# Patient Record
Sex: Female | Born: 1980 | Race: White | Hispanic: No | Marital: Single | Smoking: Current every day smoker
Health system: Southern US, Community
[De-identification: ages and names within clinical notes are randomized; demographics above are authoritative.]

## PROBLEM LIST (undated history)

## (undated) DIAGNOSIS — F419 Anxiety disorder, unspecified: Secondary | ICD-10-CM

## (undated) DIAGNOSIS — I10 Essential (primary) hypertension: Secondary | ICD-10-CM

---

## 2001-09-27 HISTORY — PX: TUBAL LIGATION: SHX77

## 2005-09-10 ENCOUNTER — Emergency Department: Payer: Self-pay | Admitting: General Practice

## 2006-03-27 ENCOUNTER — Emergency Department: Payer: Self-pay | Admitting: Emergency Medicine

## 2006-05-04 ENCOUNTER — Emergency Department: Payer: Self-pay | Admitting: Emergency Medicine

## 2006-12-19 ENCOUNTER — Emergency Department: Payer: Self-pay | Admitting: Emergency Medicine

## 2007-05-25 ENCOUNTER — Emergency Department: Payer: Self-pay | Admitting: Emergency Medicine

## 2008-06-25 ENCOUNTER — Emergency Department: Payer: Self-pay | Admitting: Emergency Medicine

## 2012-02-29 ENCOUNTER — Emergency Department: Payer: Self-pay | Admitting: Emergency Medicine

## 2012-02-29 LAB — CBC
HCT: 41.2 % (ref 35.0–47.0)
HGB: 13.5 g/dL (ref 12.0–16.0)
MCHC: 32.8 g/dL (ref 32.0–36.0)
Platelet: 290 10*3/uL (ref 150–440)
RBC: 4.68 10*6/uL (ref 3.80–5.20)
WBC: 8.3 10*3/uL (ref 3.6–11.0)

## 2012-02-29 LAB — URINALYSIS, COMPLETE
Bilirubin,UR: NEGATIVE
Blood: NEGATIVE
Glucose,UR: NEGATIVE mg/dL (ref 0–75)
Nitrite: NEGATIVE
Ph: 5 (ref 4.5–8.0)
RBC,UR: 2 /HPF (ref 0–5)
WBC UR: 6 /HPF (ref 0–5)

## 2012-02-29 LAB — COMPREHENSIVE METABOLIC PANEL
Albumin: 3.5 g/dL (ref 3.4–5.0)
Anion Gap: 9 (ref 7–16)
Chloride: 108 mmol/L — ABNORMAL HIGH (ref 98–107)
Creatinine: 0.73 mg/dL (ref 0.60–1.30)
EGFR (African American): >60
Glucose: 88 mg/dL (ref 65–99)
Osmolality: 281 (ref 275–301)
Potassium: 4 mmol/L (ref 3.5–5.1)
SGOT(AST): 27 U/L (ref 15–37)
SGPT (ALT): 26 U/L

## 2012-02-29 LAB — LIPASE, BLOOD: Lipase: 154 U/L (ref 73–393)

## 2012-03-01 LAB — URINALYSIS, COMPLETE
Bilirubin,UR: NEGATIVE
Blood: NEGATIVE
Glucose,UR: NEGATIVE mg/dL (ref 0–75)
Leukocyte Esterase: NEGATIVE
Nitrite: NEGATIVE
Ph: 7 (ref 4.5–8.0)
RBC,UR: <1 /HPF (ref 0–5)
Specific Gravity: 1.009 (ref 1.003–1.030)
WBC UR: 1 /HPF (ref 0–5)

## 2012-11-20 ENCOUNTER — Emergency Department: Payer: Self-pay | Admitting: Emergency Medicine

## 2012-11-20 LAB — COMPREHENSIVE METABOLIC PANEL
Albumin: 3.7 g/dL (ref 3.4–5.0)
Anion Gap: 3 — ABNORMAL LOW (ref 7–16)
Bilirubin,Total: 0.3 mg/dL (ref 0.2–1.0)
Co2: 27 mmol/L (ref 21–32)
Creatinine: 0.75 mg/dL (ref 0.60–1.30)
EGFR (Non-African Amer.): >60
Osmolality: 277 (ref 275–301)
Potassium: 4.4 mmol/L (ref 3.5–5.1)
SGOT(AST): 28 U/L (ref 15–37)
SGPT (ALT): 26 U/L (ref 12–78)
Sodium: 139 mmol/L (ref 136–145)
Total Protein: 7.9 g/dL (ref 6.4–8.2)

## 2012-11-20 LAB — CBC
HCT: 44.8 % (ref 35.0–47.0)
MCH: 28.4 pg (ref 26.0–34.0)
MCV: 88 fL (ref 80–100)
Platelet: 333 10*3/uL (ref 150–440)
RDW: 13.8 % (ref 11.5–14.5)
WBC: 13.3 10*3/uL — ABNORMAL HIGH (ref 3.6–11.0)

## 2012-11-20 LAB — URINALYSIS, COMPLETE
Leukocyte Esterase: NEGATIVE
Nitrite: NEGATIVE
Ph: 5 (ref 4.5–8.0)
RBC,UR: 5 /HPF (ref 0–5)
Squamous Epithelial: 27
WBC UR: 1 /HPF (ref 0–5)

## 2012-11-20 LAB — LIPASE, BLOOD: Lipase: 166 U/L (ref 73–393)

## 2013-04-15 IMAGING — US ABDOMEN ULTRASOUND LIMITED
1 series · 14 of 25 positions shown · non-contrast
Comparison: none

REASON FOR EXAM: RUQ pain, nausea
COMMENTS:   Body Site: GB and Fossa, CBD, Head of Pancreas

[Series 1: abdomen ultrasound limited · 0.23mm/px · 14 of 40 slices shown]
[im 1/40]
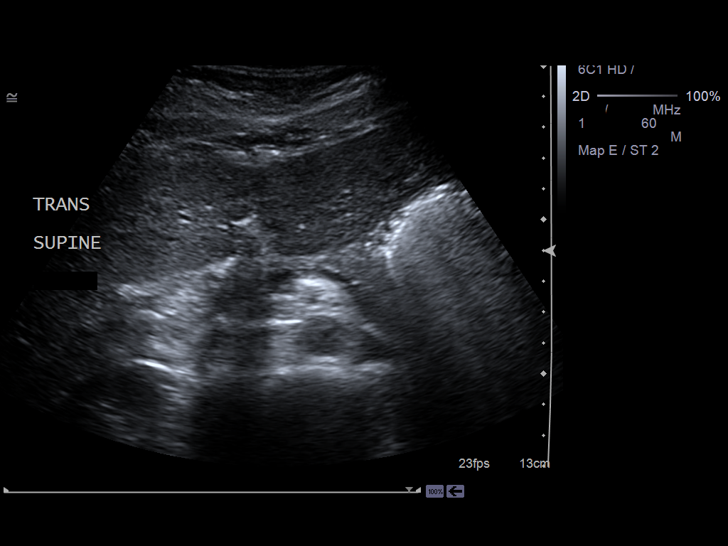
[im 4/40]
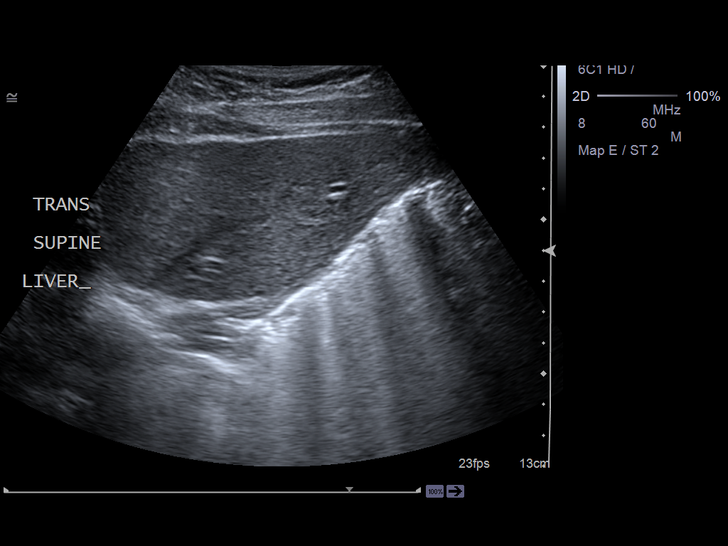
[im 7/40]
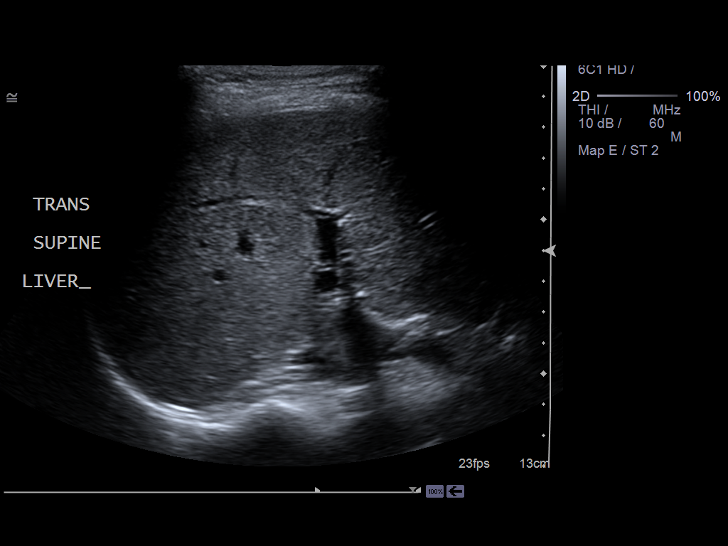
[im 10/40]
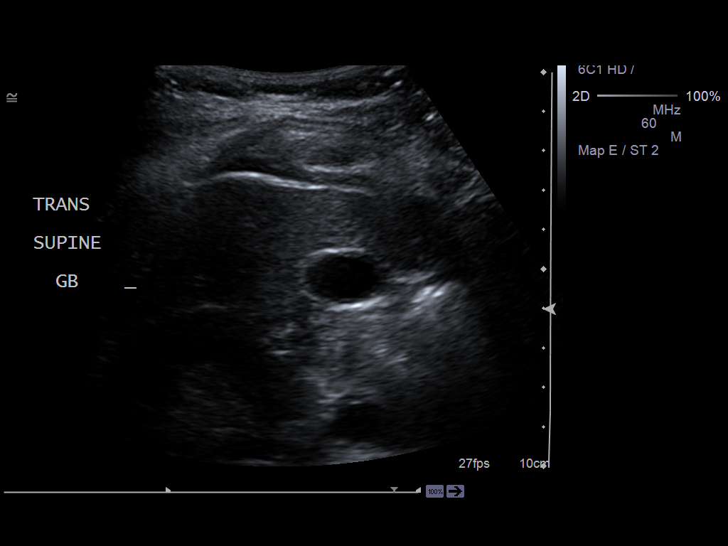
[im 14/40]
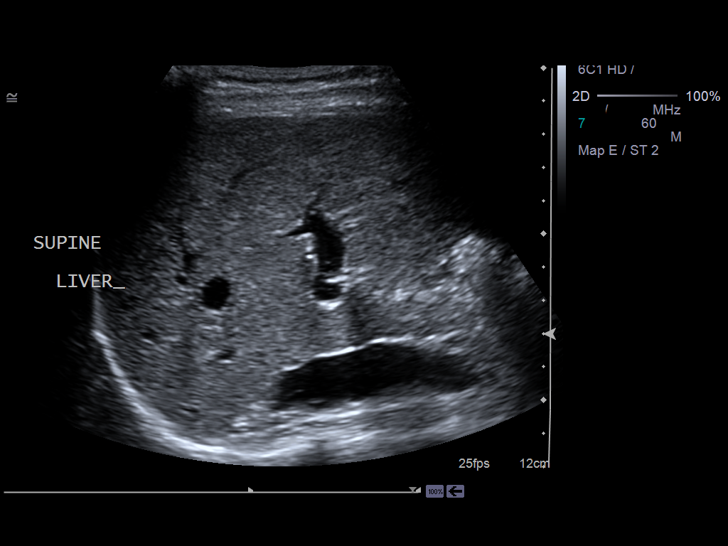
[im 15/40]
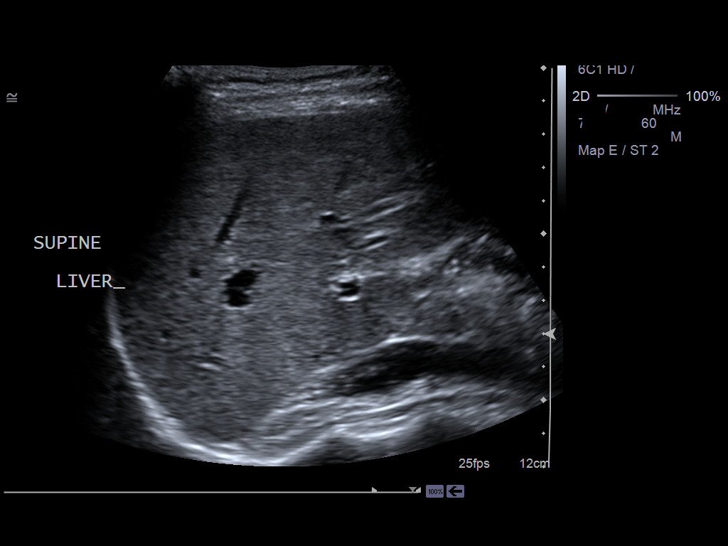
[im 18/40]
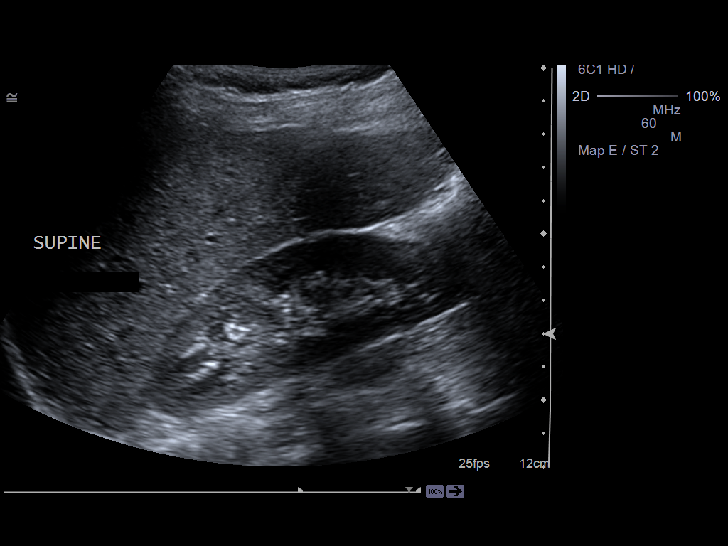
[im 22/40]
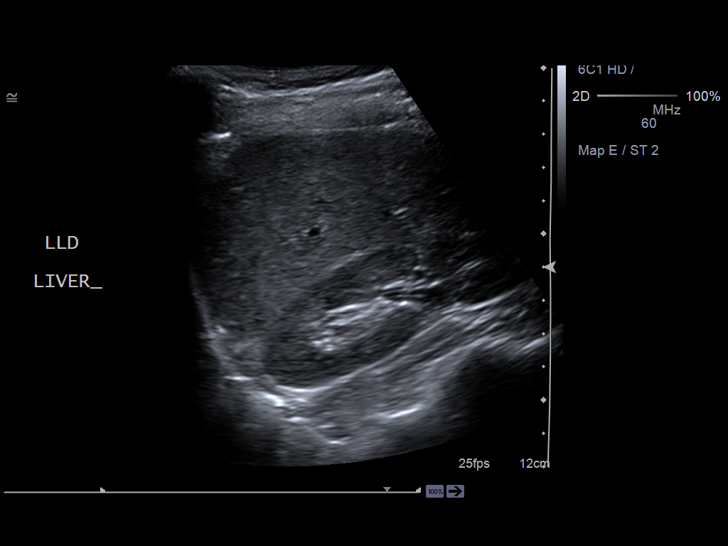
[im 25/40]
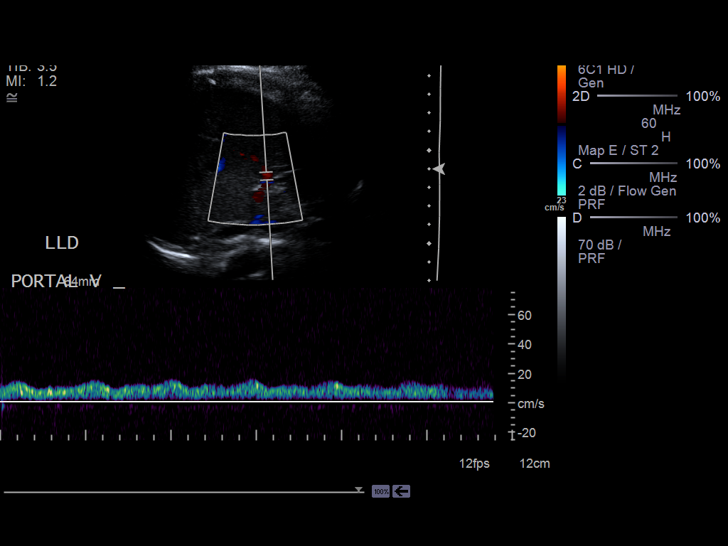
[im 27/40]
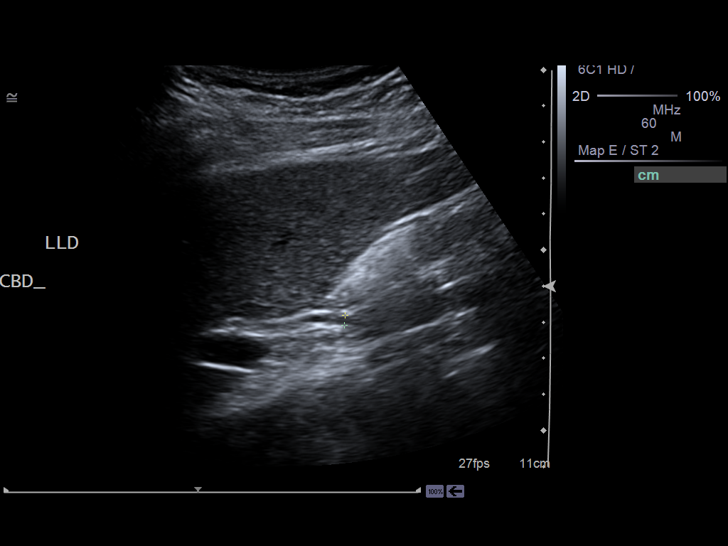
[im 30/40]
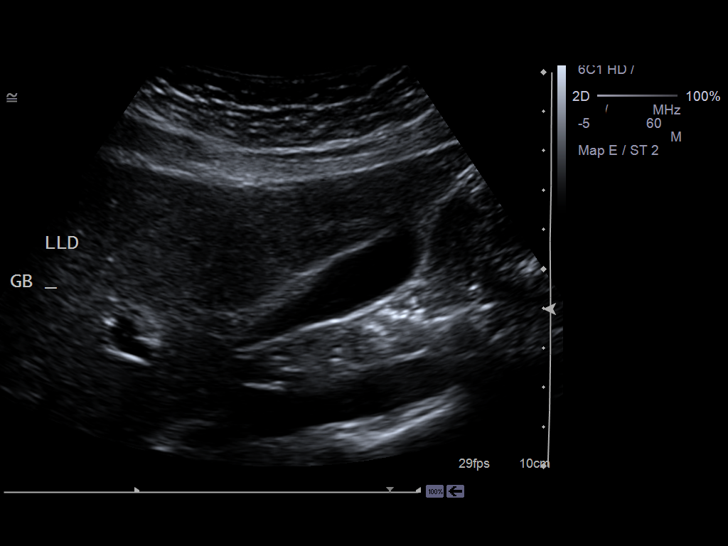
[im 33/40]
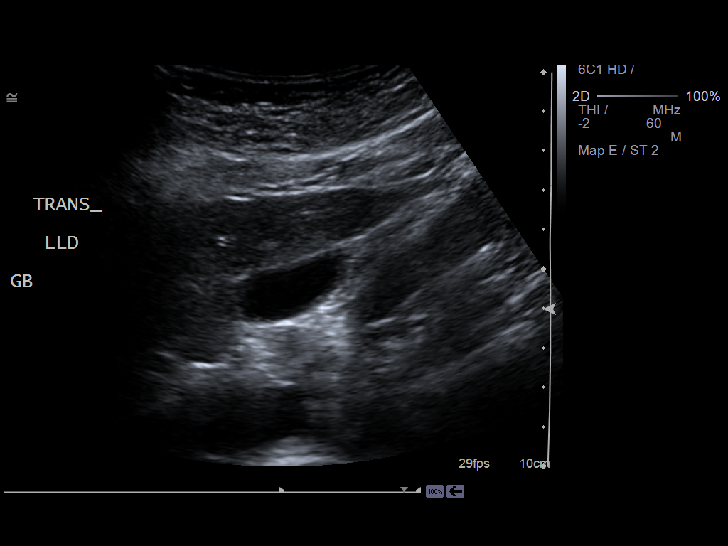
[im 36/40]
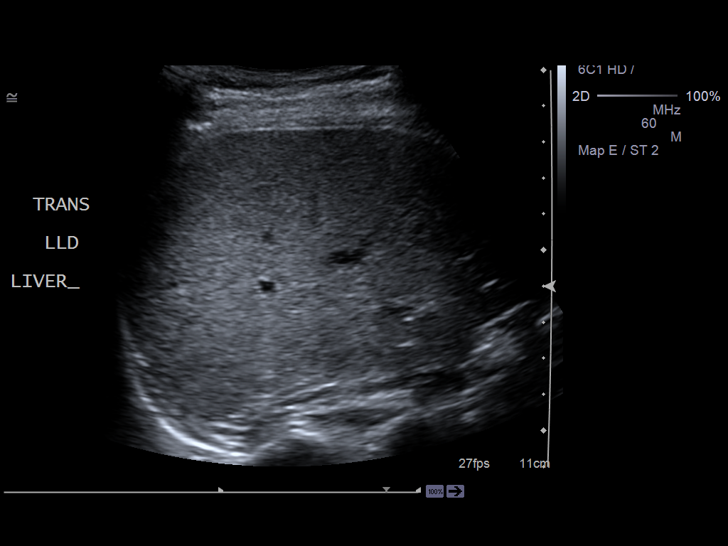
[im 40/40]
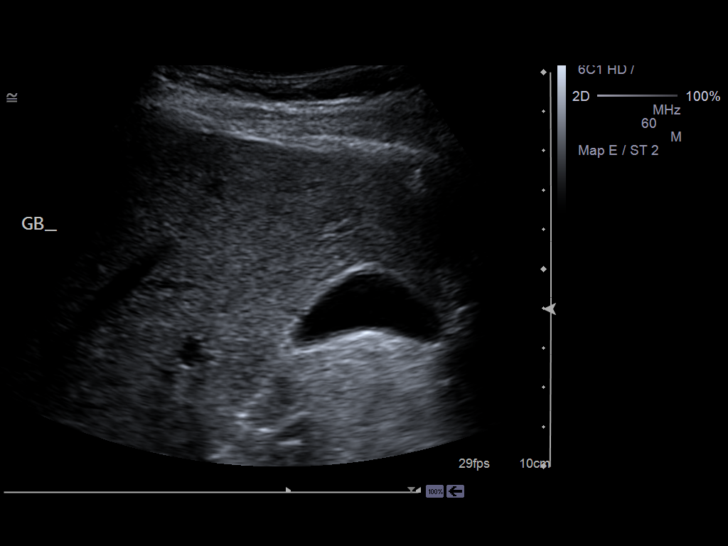

[14 of 25 positions shown; findings below may reference images not displayed]

PROCEDURE:     US  - US ABDOMEN LIMITED SURVEY  - March 01, 2012  [DATE]

RESULT:     A limited right upper quadrant ultrasound was performed. The
gallbladder is normal in appearance with no evidence of stones or positive
sonographic Murphy's sign. There is no gallbladder wall thickening. The
common bile duct measured 3.2 mm in diameter. The pancreatic head is normal
in echotexture and contour. The observed portions of the liver exhibit no
acute abnormality. Portal venous flow is normal in direction toward the
liver. The right kidney is normal in appearance where visualized.
IMPRESSION: Normal right upper quadrant ultrasound examination.

## 2013-04-15 IMAGING — CT CT HEAD WITHOUT CONTRAST
2 series · 16 of 30 positions shown, 20 images · non-contrast
Comparison: none

REASON FOR EXAM: right arm and leg weakness
COMMENTS:

[Series 2: without · axial · non-contrast · 0.39mm/px · z∈[+1098,+1218]mm · 13 of 30 slices shown, 17 images]
[im 3/30  brain]
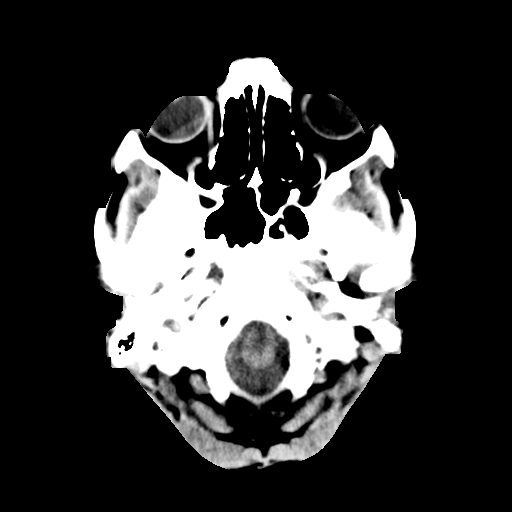
[im 3/30  bone]
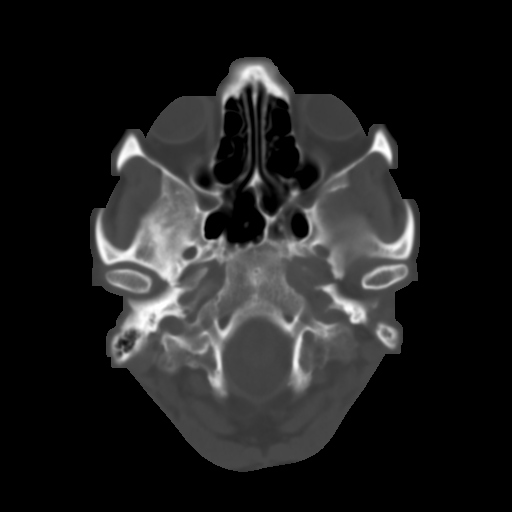
[im 5/30  brain]
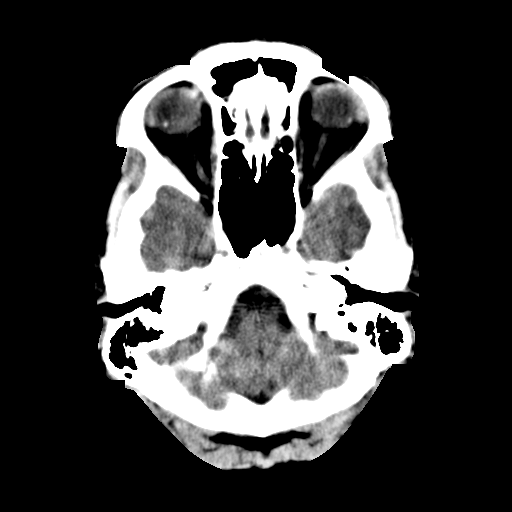
[im 7/30  brain]
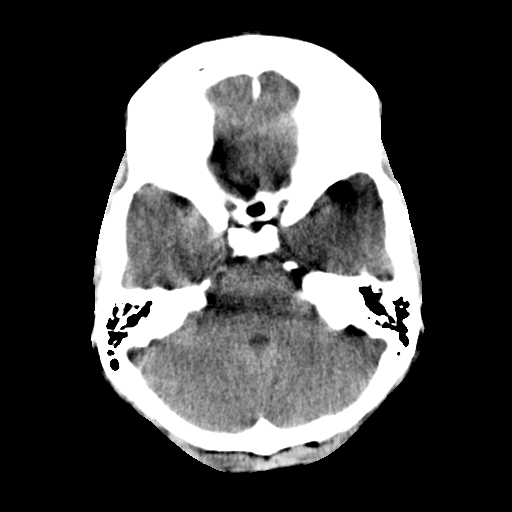
[im 9/30  brain]
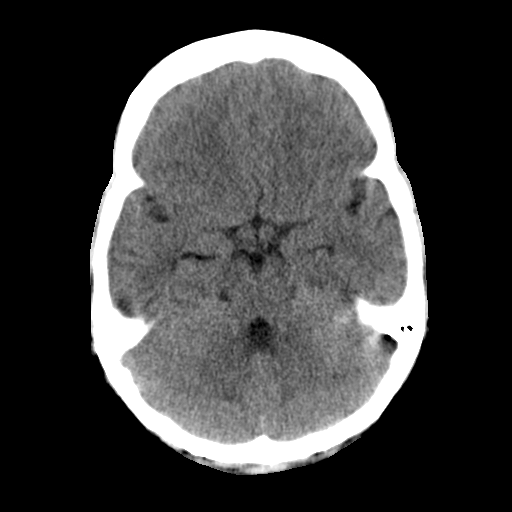
[im 11/30  brain]
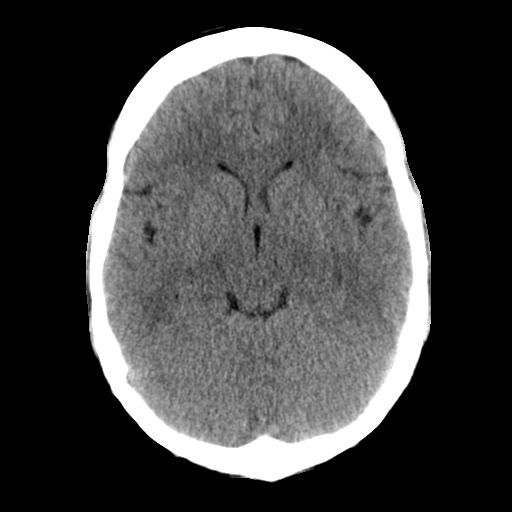
[im 11/30  bone]
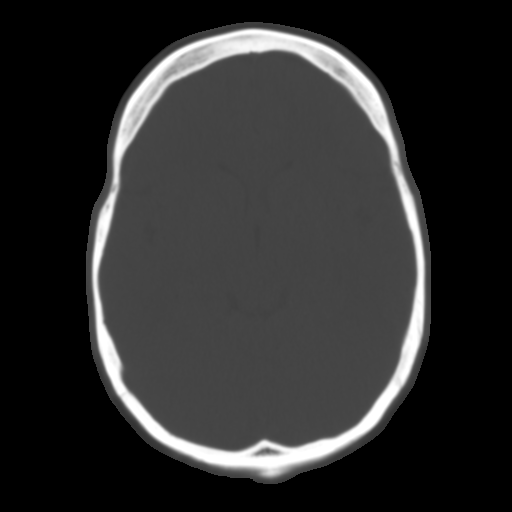
[im 13/30  brain]
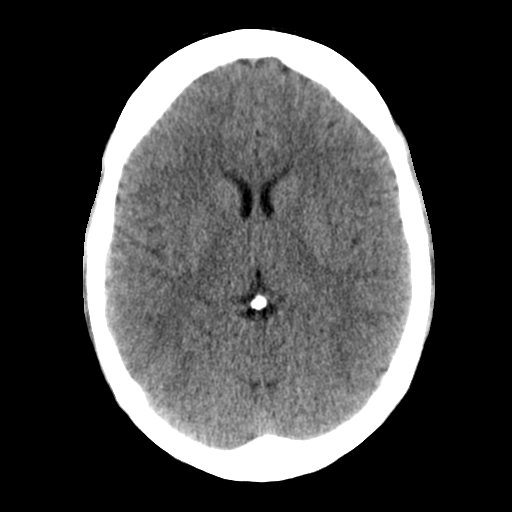
[im 15/30  brain]
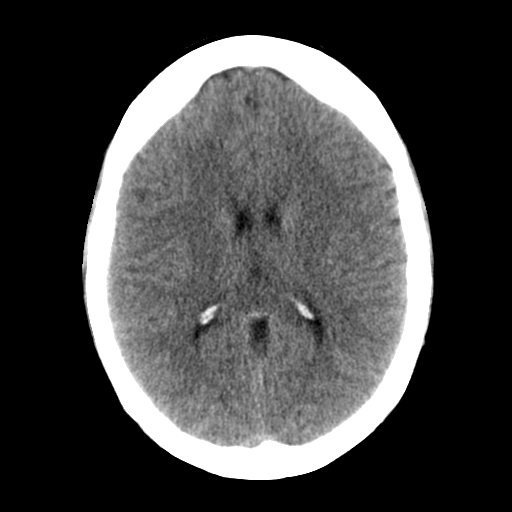
[im 17/30  brain]
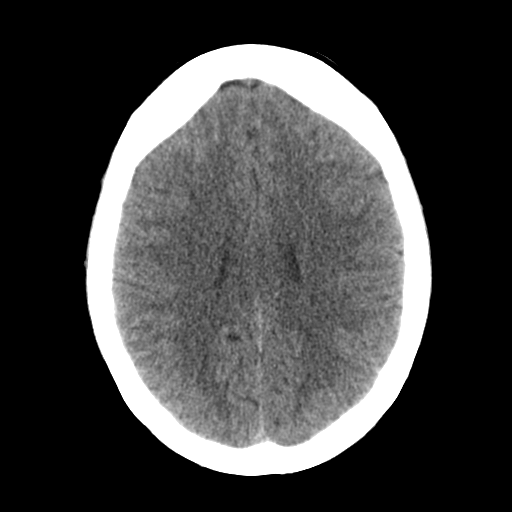
[im 19/30  brain]
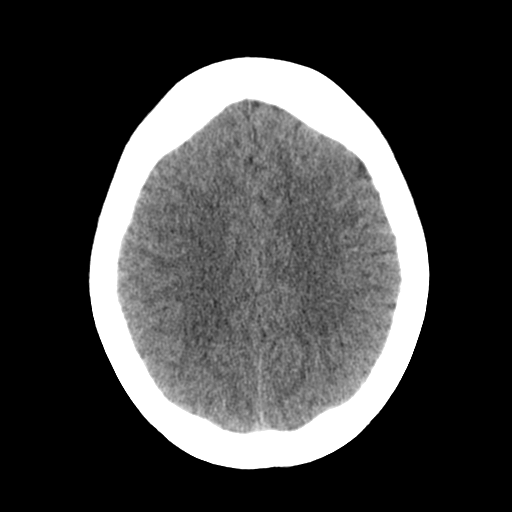
[im 19/30  bone]
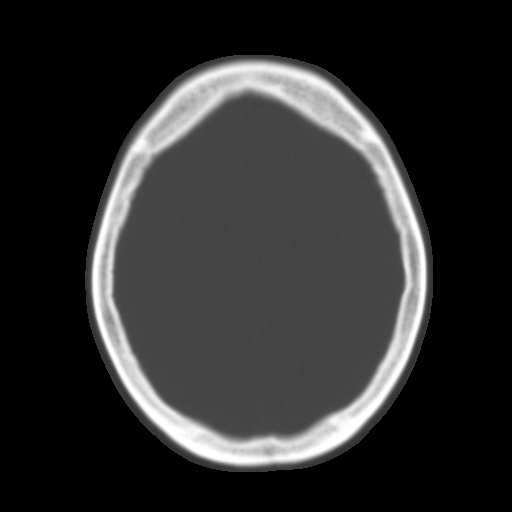
[im 21/30  brain]
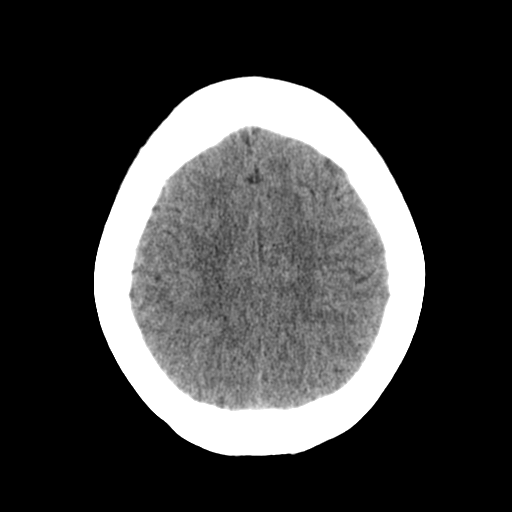
[im 23/30  brain]
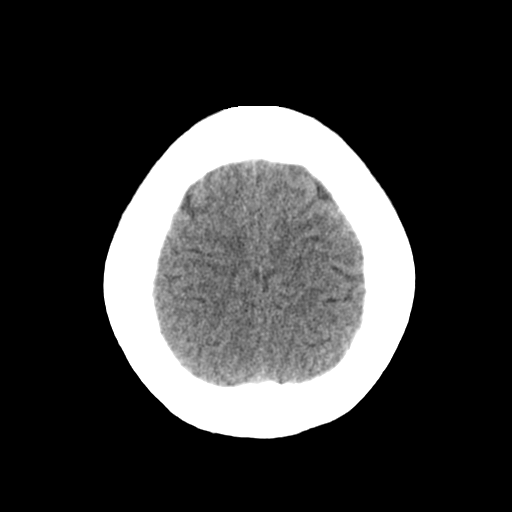
[im 25/30  brain]
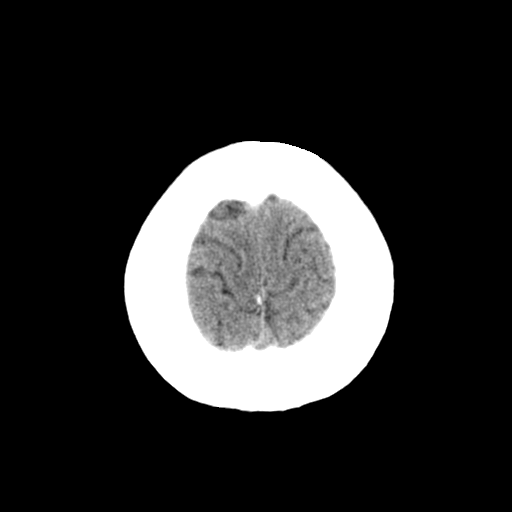
[im 27/30  brain]
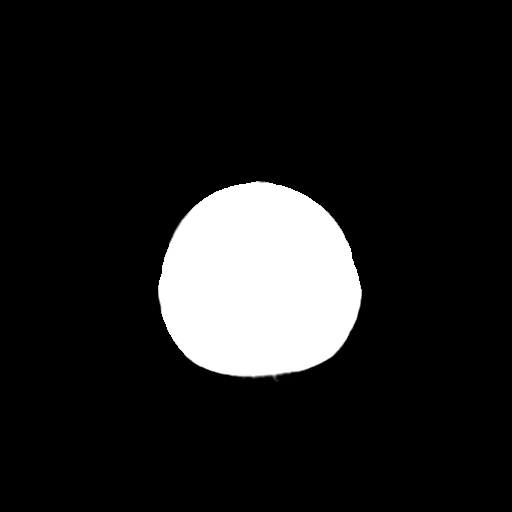
[im 27/30  bone]
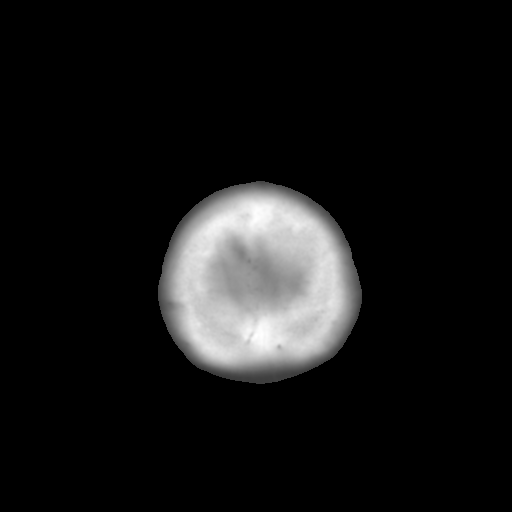

[Series 3: bone · axial · 0.39mm/px · z∈[+1098,+1138]mm · 3 of 30 slices shown]
[im 3/30  bone]
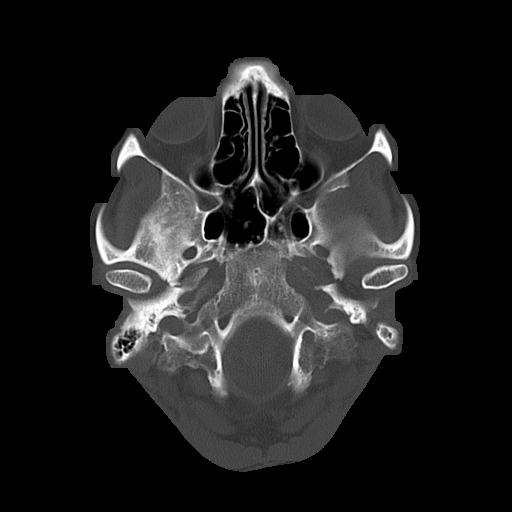
[im 7/30  bone]
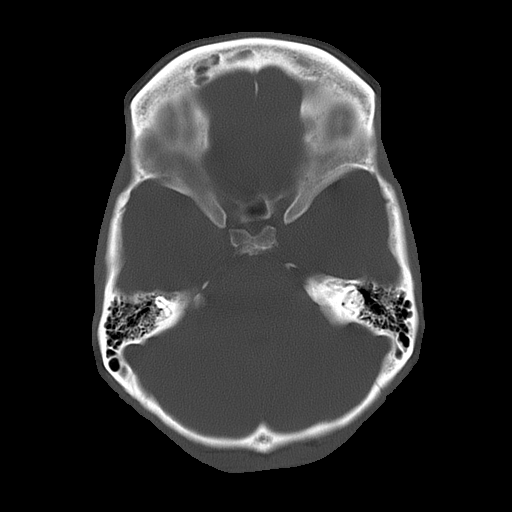
[im 11/30  bone]
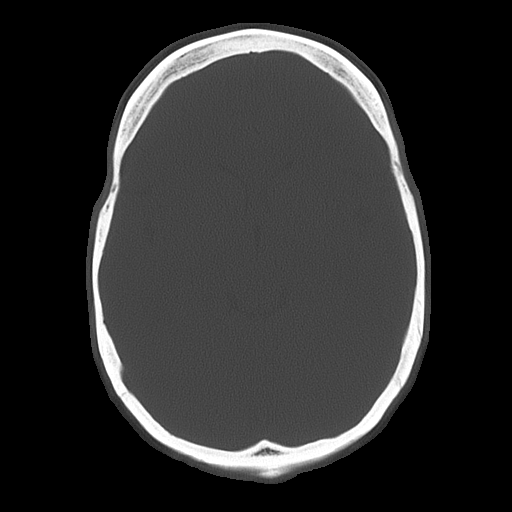

[16 of 30 positions shown; findings below may reference images not displayed]

PROCEDURE:     CT  - CT HEAD WITHOUT CONTRAST  - March 01, 2012  [DATE]

RESULT:     Axial noncontrast CT scanning was performed through the brain
with reconstructions at 5 mm intervals and slice thicknesses.

The ventricles are normal in size and position. There is no intracranial
hemorrhage nor intracranial mass effect. There is no evidence of an evolving
ischemic event. The cerebellum and brainstem are normal in density. I see no
abnormal intracranial calcifications. At bone window settings I see no
evidence of an acute skull fracture in the observed portions of the
paranasal sinuses are clear.
IMPRESSION: Normal noncontrast CT scan of the brain.

A preliminary report was sent to the [HOSPITAL] the conclusion
of the study.

## 2013-08-03 ENCOUNTER — Emergency Department: Payer: Self-pay | Admitting: Emergency Medicine

## 2013-09-28 ENCOUNTER — Emergency Department: Payer: Self-pay | Admitting: Emergency Medicine

## 2014-10-03 ENCOUNTER — Ambulatory Visit: Payer: Self-pay | Admitting: Urgent Care

## 2015-08-31 ENCOUNTER — Encounter: Payer: Self-pay | Admitting: Emergency Medicine

## 2015-08-31 ENCOUNTER — Emergency Department
Admission: EM | Admit: 2015-08-31 | Discharge: 2015-09-01 | Discharge status: Against Medical Advice | Payer: Medicaid Other | Attending: Emergency Medicine | Admitting: Emergency Medicine

## 2015-08-31 ENCOUNTER — Emergency Department: Payer: Medicaid Other

## 2015-08-31 DIAGNOSIS — F17210 Nicotine dependence, cigarettes, uncomplicated: Secondary | ICD-10-CM | POA: Insufficient documentation

## 2015-08-31 DIAGNOSIS — R109 Unspecified abdominal pain: Secondary | ICD-10-CM | POA: Insufficient documentation

## 2015-08-31 DIAGNOSIS — Z3202 Encounter for pregnancy test, result negative: Secondary | ICD-10-CM | POA: Insufficient documentation

## 2015-08-31 DIAGNOSIS — Z79899 Other long term (current) drug therapy: Secondary | ICD-10-CM | POA: Insufficient documentation

## 2015-08-31 DIAGNOSIS — R079 Chest pain, unspecified: Secondary | ICD-10-CM | POA: Diagnosis not present

## 2015-08-31 DIAGNOSIS — R112 Nausea with vomiting, unspecified: Secondary | ICD-10-CM | POA: Diagnosis present

## 2015-08-31 DIAGNOSIS — F419 Anxiety disorder, unspecified: Secondary | ICD-10-CM | POA: Insufficient documentation

## 2015-08-31 DIAGNOSIS — R1013 Epigastric pain: Secondary | ICD-10-CM

## 2015-08-31 LAB — URINALYSIS COMPLETE WITH MICROSCOPIC (ARMC ONLY)
BILIRUBIN URINE: NEGATIVE
Bacteria, UA: NONE SEEN
Glucose, UA: NEGATIVE mg/dL
KETONES UR: NEGATIVE mg/dL
Nitrite: NEGATIVE
PH: 6 (ref 5.0–8.0)
PROTEIN: NEGATIVE mg/dL
Specific Gravity, Urine: 1.006 (ref 1.005–1.030)

## 2015-08-31 LAB — BASIC METABOLIC PANEL
ANION GAP: 5 (ref 5–15)
BUN: 11 mg/dL (ref 6–20)
CALCIUM: 9.1 mg/dL (ref 8.9–10.3)
CHLORIDE: 108 mmol/L (ref 101–111)
CO2: 26 mmol/L (ref 22–32)
Creatinine, Ser: 0.72 mg/dL (ref 0.44–1.00)
GFR calc non Af Amer: >60 mL/min (ref >60)
GLUCOSE: 98 mg/dL (ref 65–99)
POTASSIUM: 3.7 mmol/L (ref 3.5–5.1)
Sodium: 139 mmol/L (ref 135–145)

## 2015-08-31 LAB — CBC
HEMATOCRIT: 42.9 % (ref 35.0–47.0)
HEMOGLOBIN: 14.1 g/dL (ref 12.0–16.0)
MCH: 28.6 pg (ref 26.0–34.0)
MCHC: 32.8 g/dL (ref 32.0–36.0)
MCV: 87.1 fL (ref 80.0–100.0)
Platelets: 388 10*3/uL (ref 150–440)
RBC: 4.93 MIL/uL (ref 3.80–5.20)
RDW: 14 % (ref 11.5–14.5)
WBC: 14.3 10*3/uL — ABNORMAL HIGH (ref 3.6–11.0)

## 2015-08-31 LAB — POCT PREGNANCY, URINE: Preg Test, Ur: NEGATIVE

## 2015-08-31 LAB — TROPONIN I

## 2015-08-31 MED ORDER — GI COCKTAIL ~~LOC~~
30.0000 mL | Freq: Once | ORAL | Status: AC
  Administered 2015-09-01: 30 mL via ORAL
  Filled 2015-08-31: qty 30

## 2015-08-31 MED ORDER — ONDANSETRON 4 MG PO TBDP
4.0000 mg | ORAL_TABLET | Freq: Once | ORAL | Status: AC
  Administered 2015-09-01: 4 mg via ORAL
  Filled 2015-08-31: qty 1

## 2015-08-31 NOTE — ED Notes (Signed)
Patient making belching noises.  No emesis seen.  Only clear spit seen.

## 2015-08-31 NOTE — ED Notes (Signed)
States while eating dinner felt some indegestion.  Boyfriend states patient has been prescribed klonipin, suboxone, adderal.  Has been trying to come off medications.  Took 1/4 of adderol today after not taking it for 1 month.  While eating had an anxiety attack, took 1/2 tab klonipin.  Patient has been taking 1/2 - 1 suboxone strip daily.

## 2015-08-31 NOTE — ED Notes (Signed)
MD Webster at bedside 

## 2015-09-01 LAB — HEPATIC FUNCTION PANEL
ALT: 19 U/L (ref 14–54)
AST: 22 U/L (ref 15–41)
Albumin: 4 g/dL (ref 3.5–5.0)
Alkaline Phosphatase: 62 U/L (ref 38–126)
Bilirubin, Direct: <0.1 mg/dL — ABNORMAL LOW (ref 0.1–0.5)
TOTAL PROTEIN: 7.1 g/dL (ref 6.5–8.1)

## 2015-09-01 LAB — LIPASE, BLOOD: LIPASE: 27 U/L (ref 11–51)

## 2015-09-01 LAB — TROPONIN I: Troponin I: <0.03 ng/mL (ref <0.031)

## 2015-09-01 NOTE — ED Provider Notes (Signed)
[facility] Emergency Department Provider Note  ____________________________________________  Time seen: Approximately 2346 PM  I have reviewed the triage vital signs and the nursing notes.   HISTORY  Chief Complaint Panic Attack and Emesis     HPI Tammy Mcdonald is a 34 y.o. female who comes into the hospital today with chest pain. The patient reports that she was eating dinner when she became stressed and had a panic attack. The patient has a history of anxiety issues and GERD. The patient's significant other reports that since then she has been vomiting and unable to lay down due to nausea. The patient is prescribed Klonopin and Adderall but does not take her Klonopin normally. The patient is also on Suboxone. The patient took her Adderall before dinner and while she was in dinner she felt as though her chest was on fire. The patient was burping and having saliva. The patient took Klonopin after she started having this episode but it was not working so she decided to come in for evaluation. The patient has had blackouts due to her anxiety in the past. The patient hasn't taken her Klonopin in 2 weeks. The patient also does not take anything for her reflux. She was taking omeprazole but reports is too expensive to afford. The patient rates her pain a 4 out of 10 in intensity and is in her epigastric area as well as her chest.  Past Medical History Anxiety GERD   There are no active problems to display for this patient.   Past surgical history BTL  Current Outpatient Rx  Name  Route  Sig  Dispense  Refill  . clonazePAM (KLONOPIN) 0.5 MG tablet   Oral   Take 1 tablet by mouth 2 (two) times daily as needed.         . SUBOXONE 8-2 MG FILM   Sublingual   Place 1 Film under the tongue 3 (three) times daily.           Dispense as written.     Allergies Review of patient's allergies indicates no known allergies.  No family history on  file.  Social History Social History  Substance Use Topics  . Smoking status: Current Every Day Smoker -- 1.00 packs/day    Types: Cigarettes  . Smokeless tobacco: Never Used  . Alcohol Use: No    Review of Systems Constitutional: No fever/chills Eyes: No visual changes. ENT: No sore throat. Cardiovascular:  chest pain. Respiratory: Denies shortness of breath. Gastrointestinal:  abdominal pain.   nausea,  vomiting.  No diarrhea.  No constipation. Genitourinary: Negative for dysuria. Musculoskeletal: Negative for back pain. Skin: Negative for rash. Neurological: Negative for headaches, focal weakness or numbness.  10-point ROS otherwise negative.  ____________________________________________   PHYSICAL EXAM:  VITAL SIGNS: ED Triage Vitals  Enc Vitals Group     BP 08/31/15 2145 155/104 mmHg     Pulse Rate 08/31/15 2145 98     Resp 08/31/15 2145 18     Temp 08/31/15 2145 97.7 F (36.5 C)     Temp Source 08/31/15 2145 Oral     SpO2 08/31/15 2145 98 %     Weight 08/31/15 2145 116 lb (52.617 kg)     Height 08/31/15 2145  (1.626 m)     Head Cir --      Peak Flow --      Pain Score 08/31/15 2145 10     Pain Loc --      Pain  Edu? --      Excl. in GC? --     Constitutional: Alert and oriented. Well appearing and in mild distress. Eyes: Conjunctivae are normal. PERRL. EOMI. Head: Atraumatic. Nose: No congestion/rhinnorhea. Mouth/Throat: Mucous membranes are moist.  Oropharynx non-erythematous. Cardiovascular: Normal rate, regular rhythm. Grossly normal heart sounds.  Good peripheral circulation. Respiratory: Normal respiratory effort.  No retractions. Lungs CTAB. Gastrointestinal: Soft with epigastric pain. No distention. Positive bowel sounds Musculoskeletal: No lower extremity tenderness nor edema.   Neurologic:  Normal speech and language.  Skin:  Skin is warm, dry and intact.  Psychiatric: Mood and affect are  normal. ____________________________________________   LABS (all labs ordered are listed, but only abnormal results are displayed)  Labs Reviewed  CBC - Abnormal; Notable for the following:    WBC 14.3 (*)    All other components within normal limits  URINALYSIS COMPLETEWITH MICROSCOPIC ([facility] ONLY) - Abnormal; Notable for the following:    Color, Urine STRAW (*)    APPearance CLEAR (*)    Hgb urine dipstick 1+ (*)    Leukocytes, UA 1+ (*)    Squamous Epithelial / LPF 0-5 (*)    All other components within normal limits  HEPATIC FUNCTION PANEL - Abnormal; Notable for the following:    Total Bilirubin <0.1 (*)    Bilirubin, Direct <0.1 (*)    All other components within normal limits  BASIC METABOLIC PANEL  TROPONIN I  LIPASE, BLOOD  TROPONIN I  POCT PREGNANCY, URINE   ____________________________________________  EKG  ED ECG REPORT I, Rebecka ApleyWebster,  Raynell Upton P, the attending physician, personally viewed and interpreted this ECG.   Date: 08/31/2015  EKG Time: 2152  Rate: 87  Rhythm: normal sinus rhythm  Axis: normal  Intervals:none  ST&T Change: none  ____________________________________________  RADIOLOGY  none ____________________________________________   PROCEDURES  Procedure(s) performed: None  Critical Care performed: No  ____________________________________________   INITIAL IMPRESSION / ASSESSMENT AND PLAN / ED COURSE  Pertinent labs & imaging results that were available during my care of the patient were reviewed by me and considered in my medical decision making (see chart for details).  This is a 34 year old female who comes in today with some chest discomfort, and feeling reflux. I ordered the patient a GI cocktail as well as some Zofran. I will attempt to see if this helps the patient's symptoms. The patient will be discharged home. I will give the patient something for her reflux and she should continue to take her anxiety medication. The patient  decided though that she did not want to remain in the emergency department waiting for her paperwork so she decided to leave prior to her paperwork. ____________________________________________   FINAL CLINICAL IMPRESSION(S) / ED DIAGNOSES  Final diagnoses:  Epigastric pain  Chest pain, unspecified chest pain type  Anxiety      Rebecka ApleyAllison P Armas Mcbee, MD 09/01/15 [phone]

## 2015-09-01 NOTE — ED Notes (Signed)
Patient left ER treatment room. Patient stated presenting sx resolved and current discharge transportation is urgently leaving at this time. Will make MD aware.

## 2016-03-31 ENCOUNTER — Encounter: Payer: Self-pay | Admitting: *Deleted

## 2016-03-31 ENCOUNTER — Emergency Department
Admission: EM | Admit: 2016-03-31 | Discharge: 2016-03-31 | Disposition: A | Discharge status: Home / Self Care | Payer: Medicaid Other | Attending: Emergency Medicine | Admitting: Emergency Medicine

## 2016-03-31 DIAGNOSIS — Y929 Unspecified place or not applicable: Secondary | ICD-10-CM | POA: Insufficient documentation

## 2016-03-31 DIAGNOSIS — S80862A Insect bite (nonvenomous), left lower leg, initial encounter: Secondary | ICD-10-CM | POA: Insufficient documentation

## 2016-03-31 DIAGNOSIS — S80861A Insect bite (nonvenomous), right lower leg, initial encounter: Secondary | ICD-10-CM | POA: Insufficient documentation

## 2016-03-31 DIAGNOSIS — S30861A Insect bite (nonvenomous) of abdominal wall, initial encounter: Secondary | ICD-10-CM | POA: Insufficient documentation

## 2016-03-31 DIAGNOSIS — Y939 Activity, unspecified: Secondary | ICD-10-CM | POA: Insufficient documentation

## 2016-03-31 DIAGNOSIS — W57XXXA Bitten or stung by nonvenomous insect and other nonvenomous arthropods, initial encounter: Secondary | ICD-10-CM | POA: Insufficient documentation

## 2016-03-31 DIAGNOSIS — Y999 Unspecified external cause status: Secondary | ICD-10-CM | POA: Insufficient documentation

## 2016-03-31 DIAGNOSIS — F17210 Nicotine dependence, cigarettes, uncomplicated: Secondary | ICD-10-CM | POA: Insufficient documentation

## 2016-03-31 DIAGNOSIS — S40861A Insect bite (nonvenomous) of right upper arm, initial encounter: Secondary | ICD-10-CM | POA: Insufficient documentation

## 2016-03-31 DIAGNOSIS — B880 Other acariasis: Secondary | ICD-10-CM | POA: Insufficient documentation

## 2016-03-31 MED ORDER — PREDNISONE 20 MG PO TABS: ORAL_TABLET | ORAL | Status: DC

## 2016-03-31 MED ORDER — SULFAMETHOXAZOLE-TRIMETHOPRIM 800-160 MG PO TABS: 1.0000 | ORAL_TABLET | Freq: Two times a day (BID) | ORAL | Status: DC

## 2016-03-31 MED ORDER — HYDROXYZINE PAMOATE 25 MG PO CAPS: 25.0000 mg | ORAL_CAPSULE | Freq: Three times a day (TID) | ORAL | Status: DC | PRN

## 2016-03-31 NOTE — ED Provider Notes (Signed)
[facility] Emergency Department Provider Note  ____________________________________________  Time seen: Approximately 2:20 PM  I have reviewed the triage vital signs and the nursing notes.   HISTORY  Chief Complaint Insect Bite    HPI Tammy Mcdonald is a 35 y.o. female , NAD, presents to the emergency department with 3 week history of insect bites. States that [place] States she went to the residence in order to be able to walk back and forth from her residence to her father's residence who needs medical care. Notes that she has had multiple insect bites about her entire body over these last few weeks has caused significant itching. States that she has recently noted red bugs in her bed and some that can arise from the bites on her limbs. Denies any exposure to anyone else with similar symptoms. Has not had any blisters or abscess lesions. Does note that she has noted yellow scabbing to the lesions on her abdomen and that these have been more symptomatic. Denies fevers, chills, body aches. Has not had any difficulty breathing or swallowing. Has not had any swelling about her lips/tongue/neck. No chest pain, shortness of breath or wheezing.   History reviewed. No pertinent past medical history.  There are no active problems to display for this patient.   No past surgical history on file.  Current Outpatient Rx  Name  Route  Sig  Dispense  Refill  . clonazePAM (KLONOPIN) 0.5 MG tablet   Oral   Take 1 tablet by mouth 2 (two) times daily as needed.         . hydrOXYzine (VISTARIL) 25 MG capsule   Oral   Take 1 capsule (25 mg total) by mouth 3 (three) times daily as needed.   21 capsule   0   . predniSONE (DELTASONE) 20 MG tablet      Take 2 tablets by mouth, once daily, for 5 days   10 tablet   0   . SUBOXONE 8-2 MG FILM   Sublingual   Place 1 Film under the tongue 3 (three)  times daily.           Dispense as written.   . sulfamethoxazole-trimethoprim (BACTRIM DS,SEPTRA DS) 800-160 MG tablet   Oral   Take 1 tablet by mouth 2 (two) times daily.   14 tablet   0     Allergies Review of patient's allergies indicates no known allergies.  No family history on file.  Social History Social History  Substance Use Topics  . Smoking status: Current Every Day Smoker -- 1.00 packs/day    Types: Cigarettes  . Smokeless tobacco: Never Used  . Alcohol Use: No     Review of Systems  Constitutional: No fever/chills, Fatigue Eyes: No visual changes. No redness, swelling ENT: No swelling about the lips/throat/tongue. Cardiovascular: No chest pain. Respiratory: No cough. No shortness of breath. No wheezing.  Gastrointestinal: No abdominal pain.  No nausea, vomiting Musculoskeletal: Negative for general myalgias.  Skin: Positive multiple, diffuse skin sores. Negative for rash, bruising. Neurological: Negative for headaches, focal weakness or numbness. No tingling 10-point ROS otherwise negative.  ____________________________________________   PHYSICAL EXAM:  VITAL SIGNS: ED Triage Vitals  Enc Vitals Group     BP --      Pulse --      Resp --      Temp --      Temp src --  SpO2 --      Weight --      Height --      Head Cir --      Peak Flow --      Pain Score --      Pain Loc --      Pain Edu? --      Excl. in GC? --      Constitutional: Alert and oriented. Well appearing and in no acute distress. Eyes: Conjunctivae are normal.  Head: Atraumatic. Neck: Supple with full range of motion. Hematological/Lymphatic/Immunilogical: No cervical lymphadenopathy. Cardiovascular:  Good peripheral circulation with 2+ pulses noted in bilateral upper and lower extremities. Respiratory: Normal respiratory effort without tachypnea or retractions.  Musculoskeletal: No lower extremity tenderness nor edema.  No joint effusions. Full range of motion of  bilateral upper and lower extremities without pain. Neurologic:  Normal speech and language. No gross focal neurologic deficits are appreciated. Gait and posture are normal Skin:  Multiple diffuse, erythematous papules noted about the patient's body. 3 skin lesions about the upper abdomen with significant surrounding erythema and honey-colored crusting due to excoriation with mild tenderness to palpation. No active infestation is noted about examination today. Skin is warm, dry and intact.  Psychiatric: Mood and affect are normal. Speech and behavior are normal. Patient exhibits appropriate insight and judgement.   ____________________________________________   LABS  None ____________________________________________  EKG  None ____________________________________________  RADIOLOGY  None ____________________________________________    PROCEDURES  Procedure(s) performed: None    Medications - No data to display   ____________________________________________   INITIAL IMPRESSION / ASSESSMENT AND PLAN / ED COURSE  Patient's diagnosis is consistent with chigger bites. Patient will be discharged home with prescriptions for Vistaril, prednisone, Bactrim DS to take as directed. [facility] [place] if symptoms persist past this treatment course. Patient is given ED precautions to return to the ED for any worsening or new symptoms.    ____________________________________________  FINAL CLINICAL IMPRESSION(S) / ED DIAGNOSES  Final diagnoses:  Chigger bites      NEW MEDICATIONS STARTED DURING THIS VISIT:  Discharge Medication List as of 03/31/2016  2:23 PM    START taking these medications   Details  hydrOXYzine (VISTARIL) 25 MG capsule Take 1 capsule (25 mg total) by mouth 3 (three) times daily as needed., Starting 03/31/2016, Until Discontinued, Print    predniSONE (DELTASONE) 20 MG tablet Take 2  tablets by mouth, once daily, for 5 days, Print    sulfamethoxazole-trimethoprim (BACTRIM DS,SEPTRA DS) 800-160 MG tablet Take 1 tablet by mouth 2 (two) times daily., Starting 03/31/2016, Until Discontinued, Print             Hope PigeonJami L Hagler, PA-C 03/31/16 1503  Jennye MoccasinBrian S Quigley, MD 03/31/16 1534

## 2016-03-31 NOTE — Discharge Instructions (Signed)
Insect Bite Mosquitoes, flies, fleas, bedbugs, and many other insects can bite. Insect bites are different from insect stings. A sting is when poison (venom) is injected into the skin. Insect bites can cause pain or itching for a few days, but they are usually not serious. Some insects can spread diseases to people through a bite. SYMPTOMS  Symptoms of an insect bite include:  Itching or pain in the bite area.  Redness and swelling in the bite area.  An open wound (skin ulcer). In many cases, symptoms last for 2-4 days.  DIAGNOSIS  This condition is usually diagnosed based on symptoms and a physical exam. TREATMENT  Treatment is usually not needed for an insect bite. Symptoms often go away on their own. Your health care provider may recommend creams or lotions to help reduce itching. Antibiotic medicines may be prescribed if the bite becomes infected. A tetanus shot may be given in some cases. If you develop an allergic reaction to an insect bite, your health care provider will prescribe medicines to treat the reaction (antihistamines). This is rare. HOME CARE INSTRUCTIONS  Do not scratch the bite area.  Keep the bite area clean and dry. Wash the bite area daily with soap and water as told by your health care provider.  If directed, applyice to the bite area.  Put ice in a plastic bag.  Place a towel between your skin and the bag.  Leave the ice on for 20 minutes, 2-3 times per day.  To help reduce itching and swelling, try applying a baking soda paste, cortisone cream, or calamine lotion to the bite area as told by your health care provider.  Apply or take over-the-counter and prescription medicines only as told by your health care provider.  If you were prescribed an antibiotic medicine, use it as told by your health care provider. Do not stop using the antibiotic even if your condition improves.  Keep all follow-up visits as told by your health care provider. This is  important. PREVENTION   Use insect repellent. The best insect repellents contain:  DEET, picaridin, oil of lemon eucalyptus (OLE), or IR3535.  Higher amounts of an active ingredient.  When you are outdoors, wear clothing that covers your arms and legs.  Avoid opening windows that do not have window screens. SEEK MEDICAL CARE IF:  You have increased redness, swelling, or pain in the bite area.  You have a fever. SEEK IMMEDIATE MEDICAL CARE IF:   You have joint pain.   You have fluid, blood, or pus coming from the bite area.  You have a headache or neck pain.  You have unusual weakness.  You have a rash.  You have chest pain or shortness of breath.  You have abdominal pain, nausea, or vomiting.  You feel unusually tired or sleepy.   This information is not intended to replace advice given to you by your health care provider. Make sure you discuss any questions you have with your health care provider.   Document Released: 10/21/2004 Document Revised: 06/04/2015 Document Reviewed: 01/29/2015 Elsevier Interactive Patient Education 2016 [employer]  

## 2016-03-31 NOTE — ED Notes (Signed)
Pt has multiple red scabbed areas to bilateral arms, legs and abdomen, pt reports moving into a new howe which is furnished, [place] the [place] walking outside [place]

## 2016-03-31 NOTE — ED Notes (Signed)
Pt in via triage with complaints of worsening insect bites over the last 3 weeks.  Pt reports renting a new place [place] the country and walking [place] back and forth to her fathers residence.  Pt reports seeing "red bugs" coming out of skin.  Pt with red lesion/bite marks to arms, legs, neck, and trunk of body.  Pt A/Ox4, no immediate distress at this time.

## 2016-08-03 ENCOUNTER — Encounter: Payer: Self-pay | Admitting: Emergency Medicine

## 2016-08-03 ENCOUNTER — Emergency Department: Payer: Self-pay

## 2016-08-03 ENCOUNTER — Emergency Department
Admission: EM | Admit: 2016-08-03 | Discharge: 2016-08-03 | Disposition: A | Discharge status: Home / Self Care | Payer: Self-pay | Attending: Emergency Medicine | Admitting: Emergency Medicine

## 2016-08-03 DIAGNOSIS — R946 Abnormal results of thyroid function studies: Secondary | ICD-10-CM | POA: Insufficient documentation

## 2016-08-03 DIAGNOSIS — F430 Acute stress reaction: Secondary | ICD-10-CM

## 2016-08-03 DIAGNOSIS — F410 Panic disorder [episodic paroxysmal anxiety] without agoraphobia: Secondary | ICD-10-CM | POA: Insufficient documentation

## 2016-08-03 DIAGNOSIS — F17210 Nicotine dependence, cigarettes, uncomplicated: Secondary | ICD-10-CM | POA: Insufficient documentation

## 2016-08-03 DIAGNOSIS — I10 Essential (primary) hypertension: Secondary | ICD-10-CM | POA: Insufficient documentation

## 2016-08-03 DIAGNOSIS — F411 Generalized anxiety disorder: Secondary | ICD-10-CM | POA: Insufficient documentation

## 2016-08-03 DIAGNOSIS — R7989 Other specified abnormal findings of blood chemistry: Secondary | ICD-10-CM

## 2016-08-03 HISTORY — DX: Essential (primary) hypertension: I10

## 2016-08-03 HISTORY — DX: Anxiety disorder, unspecified: F41.9

## 2016-08-03 LAB — BASIC METABOLIC PANEL
Anion gap: 8 (ref 5–15)
BUN: 9 mg/dL (ref 6–20)
CHLORIDE: 106 mmol/L (ref 101–111)
CO2: 25 mmol/L (ref 22–32)
CREATININE: 0.83 mg/dL (ref 0.44–1.00)
Calcium: 9.6 mg/dL (ref 8.9–10.3)
GFR calc Af Amer: >60 mL/min (ref >60)
GFR calc non Af Amer: >60 mL/min (ref >60)
GLUCOSE: 91 mg/dL (ref 65–99)
Potassium: 3.5 mmol/L (ref 3.5–5.1)
SODIUM: 139 mmol/L (ref 135–145)

## 2016-08-03 LAB — T4, FREE: FREE T4: 0.94 ng/dL (ref 0.61–1.12)

## 2016-08-03 LAB — CBC
HCT: 45.2 % (ref 35.0–47.0)
HEMOGLOBIN: 14.6 g/dL (ref 12.0–16.0)
MCH: 28.8 pg (ref 26.0–34.0)
MCHC: 32.3 g/dL (ref 32.0–36.0)
MCV: 89.3 fL (ref 80.0–100.0)
PLATELETS: 437 10*3/uL (ref 150–440)
RBC: 5.07 MIL/uL (ref 3.80–5.20)
RDW: 14.2 % (ref 11.5–14.5)
WBC: 10.7 10*3/uL (ref 3.6–11.0)

## 2016-08-03 LAB — TROPONIN I: Troponin I: <0.03 ng/mL (ref <0.03)

## 2016-08-03 LAB — TSH: TSH: 0.289 u[IU]/mL — AB (ref 0.350–4.500)

## 2016-08-03 MED ORDER — METOPROLOL TARTRATE 25 MG PO TABS
25.0000 mg | ORAL_TABLET | Freq: Once | ORAL | Status: AC
  Administered 2016-08-03: 25 mg via ORAL
  Filled 2016-08-03: qty 1

## 2016-08-03 MED ORDER — PROPRANOLOL HCL 40 MG PO TABS: 40.0000 mg | ORAL_TABLET | Freq: Two times a day (BID) | ORAL | 0 refills | Status: AC

## 2016-08-03 MED ORDER — DIAZEPAM 2 MG PO TABS
2.0000 mg | ORAL_TABLET | Freq: Once | ORAL | Status: AC
  Administered 2016-08-03: 2 mg via ORAL
  Filled 2016-08-03: qty 1

## 2016-08-03 NOTE — ED Notes (Signed)
Patient states she has been trying to wean herself off the Suboxone and took 1/4 dose yesterday.

## 2016-08-03 NOTE — ED Notes (Signed)
Patient states she is worried about being HIV positive because her boyfriend that she recently broke up with has it.  Patient states this is what is causing some of her stress. Patient states she doesn't want her parents to know.

## 2016-08-03 NOTE — ED Triage Notes (Signed)
"  I feel like I am having a bad anxiety attack".  Onset today around 1400.  Feeling shacky

## 2016-08-03 NOTE — ED Notes (Signed)
Patient's parents are at bedside. Patient appears more calm, looking at her phone. NAD.

## 2016-08-03 NOTE — Discharge Instructions (Signed)
Your elevated blood pressure today may be due to your anxiety, but could also be related to essential hypertension. Please have your primary care physician recheck your blood pressure. Please also discuss your anxiety with your mental health provider and your primary care physician to make a long-term management plan.  Return to the emergency department if you develop severe pain, lightheadedness or fainting, shortness of breath, fever, thoughts of hurting you're self or anyone else, hallucinations, or any other symptoms concerning to you.

## 2016-08-03 NOTE — ED Provider Notes (Signed)
[facility] Emergency Department Provider Note  ____________________________________________  Time seen: Approximately 5:15 PM  I have reviewed the triage vital signs and the nursing notes.   HISTORY  Chief Complaint Panic Attack    HPI Tammy Mcdonald is a 35 y.o. female with a history of anxiety presenting for a panic attack. The patient reports that she has previously been on Klonopin for anxiety, but weaned herself several months ago. Over the past month, "everything in my life is changed" and it has been causing significant stress. Today, she was feeling particularly "shaky" with associated chest pain and shortness of breath. The patient has an appointment at [facility] for her mental health tomorrow at noon.  The patient reports she has never been diagnosed with hypertension, but it does run in her family.  FH: HTN   Past Medical History:  Diagnosis Date  . Anxiety   . Hypertension     There are no active problems to display for this patient.   History reviewed. No pertinent surgical history.    Allergies Patient has no known allergies.  No family history on file.  Social History Social History  Substance Use Topics  . Smoking status: Current Every Day Smoker    Packs/day: 1.00    Types: Cigarettes  . Smokeless tobacco: Never Used  . Alcohol use No    Review of Systems Constitutional: No fever/chills.No lightheadedness or syncope. Eyes: No visual changes. ENT: No sore throat. No congestion or rhinorrhea. Cardiovascular: Positive chest pain. Denies palpitations. Respiratory: Positive shortness of breath.  No cough. Gastrointestinal: No abdominal pain.  No nausea, no vomiting.  No diarrhea.  No constipation. Genitourinary: Negative for dysuria. Musculoskeletal: Negative for back pain. Skin: Negative for rash. Neurological: Negative for headaches. No focal numbness, tingling or weakness.  Psychiatric:Positive anxiety. Negative  SI, HI or hallucinations. 10-point ROS otherwise negative.  ____________________________________________   PHYSICAL EXAM:  VITAL SIGNS: ED Triage Vitals  Enc Vitals Group     BP 08/03/16 1559 (!) 194/152     Pulse Rate 08/03/16 1559 (!) 114     Resp 08/03/16 1559 (!) 22     Temp 08/03/16 1559 98.1 F (36.7 C)     Temp Source 08/03/16 1559 Oral     SpO2 08/03/16 1559 100 %     Weight 08/03/16 1556 116 lb (52.6 kg)     Height 08/03/16 1556 5\' 4"  (1.626 m)     Head Circumference --      Peak Flow --      Pain Score --      Pain Loc --      Pain Edu? --      Excl. in GC? --     Constitutional: Alert and oriented. Anxious but in no acute distress. Answers questions appropriately. Fidgety in the bed but comfortable appearing Eyes: Conjunctivae are normal.  EOMI. No scleral icterus. Head: Atraumatic. Nose: No congestion/rhinnorhea. Mouth/Throat: Mucous membranes are moist.  Neck: No stridor.  Supple.  No JVD. Cardiovascular: Normal rate but intermittently tachycardic to the low 100s when she gets worked up, regular rhythm. No murmurs, rubs or gallops.  Respiratory: Normal respiratory effort.  No accessory muscle use or retractions. Lungs CTAB.  No wheezes, rales or ronchi. Gastrointestinal: Soft, nontender and nondistended.  No guarding or rebound.  No peritoneal signs. Musculoskeletal: No LE edema. No ttp in the calves or palpable cords.  Negative Homan's sign. Neurologic:  A&Ox3.  Speech is clear.  Face and smile are  symmetric.  EOMI.  Moves all extremities well. Skin:  Skin is warm, dry and intact. No rash noted. Psychiatric: Anxious mood and affect. Speech and behavior are normal.  Normal judgement.  ____________________________________________   LABS (all labs ordered are listed, but only abnormal results are displayed)  Labs Reviewed  TSH - Abnormal; Notable for the following:       Result Value   TSH 0.289 (*)    All other components within normal limits  CBC   BASIC METABOLIC PANEL  TROPONIN I  T4, FREE  POC URINE PREG, ED   ____________________________________________  EKG  ED ECG REPORT I, Rockne MenghiniNorman, Anne-Caroline, the attending physician, personally viewed and interpreted this ECG.   Date: 08/03/2016  EKG Time: 1731  Rate: 91  Rhythm: normal sinus rhythm  Axis: normal  Intervals:none  ST&T Change: No STEMI  ____________________________________________  RADIOLOGY  Dg Chest 2 View  Result Date: 08/03/2016 CLINICAL DATA:  Shortness of breath today.  Patient is a smoker. EXAM: CHEST  2 VIEW COMPARISON:  08/31/2015 FINDINGS: The cardiac silhouette, mediastinal and hilar contours are normal and stable. Mild hyperinflation. No infiltrates or effusions. The bony thorax is intact. IMPRESSION: Mild hyperinflation but no acute pulmonary findings. Electronically Signed   By: Rudie MeyerP.  Gallerani M.D.   On: 08/03/2016 18:02    ____________________________________________   PROCEDURES  Procedure(s) performed: None  Procedures  Critical Care performed: No ____________________________________________   INITIAL IMPRESSION / ASSESSMENT AND PLAN / ED COURSE  Pertinent labs & imaging results that were available during my care of the patient were reviewed by me and considered in my medical decision making (see chart for details).  35 y.o. female with a history of anxiety not currently under treatment presenting with panic attack in the setting of increased stress at home. The patient does have markedly hypertension with a blood pressure of 180/115 on arrival and 167/120 and my examination. The most that the cause of her chest pain and shortness of breath is anxiety related, but will get a troponin, chest x-ray, and basic labs to evaluate for cardiac causes of chest pain. In the meantime, I will also treat her with an anti-anxiolytic. We have discussed follow-up with her primary care physician for long-term management of her anxiety and recheck of her  hypertension, as well as keeping her appointment with [facility] for her psychiatric follow-up tomorrow.  ----------------------------------------- 6:58 PM on 08/03/2016 -----------------------------------------  The patient's laboratory studies are reassuring with the exception of a mildly depressed TSH 8.29. I've spoken with the endocrinologist on-call, who will see the patient tomorrow. Given that the patient has been intermittent may tachycardic and consistently hypertensive here, I will initiate her on propranolol. These vital sign abnormalities may be due to essential hypertension or anxiety, but if she has hyperthyroidism, this may be the cause is well. I have discussed the findings with the patient and her father, who understand the discharge plan, follow-up plan, and return precautions.  ____________________________________________  FINAL CLINICAL IMPRESSION(S) / ED DIAGNOSES  Final diagnoses:  Hypertension, unspecified type  Panic attack as reaction to stress  Generalized anxiety disorder  Low TSH level    Clinical Course       NEW MEDICATIONS STARTED DURING THIS VISIT:  New Prescriptions   PROPRANOLOL (INDERAL) 40 MG TABLET    Take 1 tablet (40 mg total) by mouth 2 (two) times daily.      Rockne MenghiniAnne-Caroline Michiko Lineman, MD 08/03/16 1859

## 2017-06-18 ENCOUNTER — Inpatient Hospital Stay
Admission: EM | Admit: 2017-06-18 | Discharge: 2017-06-22 | DRG: 442 | Disposition: A | Discharge status: Home / Self Care | Payer: Self-pay | Attending: Internal Medicine | Admitting: Internal Medicine

## 2017-06-18 ENCOUNTER — Encounter: Payer: Self-pay | Admitting: Medical Oncology

## 2017-06-18 ENCOUNTER — Emergency Department: Payer: Self-pay

## 2017-06-18 DIAGNOSIS — I10 Essential (primary) hypertension: Secondary | ICD-10-CM | POA: Diagnosis present

## 2017-06-18 DIAGNOSIS — K7290 Hepatic failure, unspecified without coma: Secondary | ICD-10-CM

## 2017-06-18 DIAGNOSIS — Z9851 Tubal ligation status: Secondary | ICD-10-CM

## 2017-06-18 DIAGNOSIS — Z716 Tobacco abuse counseling: Secondary | ICD-10-CM

## 2017-06-18 DIAGNOSIS — R7989 Other specified abnormal findings of blood chemistry: Secondary | ICD-10-CM

## 2017-06-18 DIAGNOSIS — F1910 Other psychoactive substance abuse, uncomplicated: Secondary | ICD-10-CM

## 2017-06-18 DIAGNOSIS — Z21 Asymptomatic human immunodeficiency virus [HIV] infection status: Secondary | ICD-10-CM | POA: Diagnosis present

## 2017-06-18 DIAGNOSIS — Z8249 Family history of ischemic heart disease and other diseases of the circulatory system: Secondary | ICD-10-CM

## 2017-06-18 DIAGNOSIS — N3001 Acute cystitis with hematuria: Secondary | ICD-10-CM

## 2017-06-18 DIAGNOSIS — R945 Abnormal results of liver function studies: Secondary | ICD-10-CM

## 2017-06-18 DIAGNOSIS — F17210 Nicotine dependence, cigarettes, uncomplicated: Secondary | ICD-10-CM | POA: Diagnosis present

## 2017-06-18 DIAGNOSIS — Z818 Family history of other mental and behavioral disorders: Secondary | ICD-10-CM

## 2017-06-18 DIAGNOSIS — B169 Acute hepatitis B without delta-agent and without hepatic coma: Principal | ICD-10-CM | POA: Diagnosis present

## 2017-06-18 DIAGNOSIS — F419 Anxiety disorder, unspecified: Secondary | ICD-10-CM | POA: Diagnosis present

## 2017-06-18 DIAGNOSIS — B179 Acute viral hepatitis, unspecified: Secondary | ICD-10-CM

## 2017-06-18 DIAGNOSIS — R112 Nausea with vomiting, unspecified: Secondary | ICD-10-CM

## 2017-06-18 DIAGNOSIS — N390 Urinary tract infection, site not specified: Secondary | ICD-10-CM | POA: Diagnosis present

## 2017-06-18 DIAGNOSIS — Z833 Family history of diabetes mellitus: Secondary | ICD-10-CM

## 2017-06-18 DIAGNOSIS — R109 Unspecified abdominal pain: Secondary | ICD-10-CM

## 2017-06-18 LAB — CBC
HEMATOCRIT: 47.7 % — AB (ref 35.0–47.0)
HEMOGLOBIN: 16.3 g/dL — AB (ref 12.0–16.0)
MCH: 29.3 pg (ref 26.0–34.0)
MCHC: 34.1 g/dL (ref 32.0–36.0)
MCV: 85.8 fL (ref 80.0–100.0)
Platelets: 360 10*3/uL (ref 150–440)
RBC: 5.55 MIL/uL — ABNORMAL HIGH (ref 3.80–5.20)
RDW: 14.9 % — ABNORMAL HIGH (ref 11.5–14.5)
WBC: 7 10*3/uL (ref 3.6–11.0)

## 2017-06-18 LAB — COMPREHENSIVE METABOLIC PANEL
ALBUMIN: 4.2 g/dL (ref 3.5–5.0)
ALK PHOS: 602 U/L — AB (ref 38–126)
ALT: 2347 U/L — AB (ref 14–54)
ANION GAP: 10 (ref 5–15)
AST: 3168 U/L — AB (ref 15–41)
BILIRUBIN TOTAL: 1.3 mg/dL — AB (ref 0.3–1.2)
BUN: 11 mg/dL (ref 6–20)
CHLORIDE: 100 mmol/L — AB (ref 101–111)
CO2: 29 mmol/L (ref 22–32)
Calcium: 9.5 mg/dL (ref 8.9–10.3)
Creatinine, Ser: 0.51 mg/dL (ref 0.44–1.00)
GFR calc Af Amer: >60 mL/min (ref >60)
GFR calc non Af Amer: >60 mL/min (ref >60)
GLUCOSE: 118 mg/dL — AB (ref 65–99)
POTASSIUM: 3.1 mmol/L — AB (ref 3.5–5.1)
Sodium: 139 mmol/L (ref 135–145)
TOTAL PROTEIN: 8.4 g/dL — AB (ref 6.5–8.1)

## 2017-06-18 LAB — RAPID HIV SCREEN (HIV 1/2 AB+AG)
HIV 1/2 Antibodies: REACTIVE — AB
HIV-1 P24 ANTIGEN - HIV24: NONREACTIVE

## 2017-06-18 LAB — URINALYSIS, COMPLETE (UACMP) WITH MICROSCOPIC
Bilirubin Urine: NEGATIVE
Glucose, UA: NEGATIVE mg/dL
Ketones, ur: NEGATIVE mg/dL
NITRITE: NEGATIVE
PROTEIN: NEGATIVE mg/dL
Specific Gravity, Urine: 1.018 (ref 1.005–1.030)
pH: 6 (ref 5.0–8.0)

## 2017-06-18 LAB — URINE DRUG SCREEN, QUALITATIVE (ARMC ONLY)
Amphetamines, Ur Screen: POSITIVE — AB
BENZODIAZEPINE, UR SCRN: NOT DETECTED
Barbiturates, Ur Screen: NOT DETECTED
CANNABINOID 50 NG, UR ~~LOC~~: POSITIVE — AB
Cocaine Metabolite,Ur ~~LOC~~: POSITIVE — AB
MDMA (Ecstasy)Ur Screen: NOT DETECTED
Methadone Scn, Ur: NOT DETECTED
Opiate, Ur Screen: NOT DETECTED
PHENCYCLIDINE (PCP) UR S: NOT DETECTED
Tricyclic, Ur Screen: NOT DETECTED

## 2017-06-18 LAB — PROTIME-INR
INR: 1.05
PROTHROMBIN TIME: 13.6 s (ref 11.4–15.2)

## 2017-06-18 LAB — LIPASE, BLOOD: Lipase: 25 U/L (ref 11–51)

## 2017-06-18 LAB — POCT PREGNANCY, URINE: PREG TEST UR: NEGATIVE

## 2017-06-18 LAB — ACETAMINOPHEN LEVEL: Acetaminophen (Tylenol), Serum: <10 ug/mL — ABNORMAL LOW (ref 10–30)

## 2017-06-18 MED ORDER — SODIUM CHLORIDE 0.9 % IV BOLUS (SEPSIS)
1000.0000 mL | Freq: Once | INTRAVENOUS | Status: AC
  Administered 2017-06-18: 1000 mL via INTRAVENOUS

## 2017-06-18 MED ORDER — ONDANSETRON HCL 4 MG/2ML IJ SOLN
4.0000 mg | Freq: Once | INTRAMUSCULAR | Status: AC
  Administered 2017-06-18: 4 mg via INTRAVENOUS
  Filled 2017-06-18: qty 2

## 2017-06-18 MED ORDER — HYDRALAZINE HCL 20 MG/ML IJ SOLN: 10.0000 mg | INTRAMUSCULAR | Status: DC | PRN

## 2017-06-18 MED ORDER — ACETAMINOPHEN 650 MG RE SUPP: 650.0000 mg | Freq: Four times a day (QID) | RECTAL | Status: DC | PRN

## 2017-06-18 MED ORDER — CEFTRIAXONE SODIUM IN DEXTROSE 20 MG/ML IV SOLN
1.0000 g | Freq: Once | INTRAVENOUS | Status: AC
  Administered 2017-06-18: 1 g via INTRAVENOUS
  Filled 2017-06-18: qty 50

## 2017-06-18 MED ORDER — ONDANSETRON HCL 4 MG/2ML IJ SOLN
4.0000 mg | Freq: Four times a day (QID) | INTRAMUSCULAR | Status: DC | PRN
  Administered 2017-06-19 – 2017-06-20 (×2): 4 mg via INTRAVENOUS
  Filled 2017-06-18 (×2): qty 2

## 2017-06-18 MED ORDER — PROPRANOLOL HCL 40 MG PO TABS
40.0000 mg | ORAL_TABLET | Freq: Two times a day (BID) | ORAL | Status: DC
  Administered 2017-06-18 – 2017-06-21 (×7): 40 mg via ORAL
  Filled 2017-06-18 (×9): qty 1

## 2017-06-18 MED ORDER — CEFTRIAXONE SODIUM IN DEXTROSE 20 MG/ML IV SOLN
1.0000 g | INTRAVENOUS | Status: DC
  Filled 2017-06-18: qty 50

## 2017-06-18 MED ORDER — PANTOPRAZOLE SODIUM 40 MG IV SOLR
40.0000 mg | Freq: Two times a day (BID) | INTRAVENOUS | Status: DC
  Administered 2017-06-18 – 2017-06-19 (×4): 40 mg via INTRAVENOUS
  Filled 2017-06-18 (×4): qty 40

## 2017-06-18 MED ORDER — NICOTINE 21 MG/24HR TD PT24
21.0000 mg | MEDICATED_PATCH | Freq: Every day | TRANSDERMAL | Status: DC
  Administered 2017-06-18: 21 mg via TRANSDERMAL
  Filled 2017-06-18 (×5): qty 1

## 2017-06-18 MED ORDER — BISACODYL 10 MG RE SUPP: 10.0000 mg | Freq: Every day | RECTAL | Status: DC | PRN

## 2017-06-18 MED ORDER — LORAZEPAM 2 MG/ML IJ SOLN
1.0000 mg | INTRAMUSCULAR | Status: DC | PRN
  Administered 2017-06-18 – 2017-06-20 (×2): 1 mg via INTRAVENOUS
  Filled 2017-06-18 (×2): qty 1

## 2017-06-18 MED ORDER — ONDANSETRON HCL 4 MG PO TABS
4.0000 mg | ORAL_TABLET | Freq: Four times a day (QID) | ORAL | Status: DC | PRN
  Filled 2017-06-18: qty 1

## 2017-06-18 MED ORDER — DOCUSATE SODIUM 100 MG PO CAPS
100.0000 mg | ORAL_CAPSULE | Freq: Two times a day (BID) | ORAL | Status: DC
  Administered 2017-06-18 – 2017-06-21 (×6): 100 mg via ORAL
  Filled 2017-06-18 (×8): qty 1

## 2017-06-18 MED ORDER — BUPRENORPHINE HCL-NALOXONE HCL 2-0.5 MG SL SUBL: 1.0000 | SUBLINGUAL_TABLET | Freq: Two times a day (BID) | SUBLINGUAL | Status: DC

## 2017-06-18 MED ORDER — BUPRENORPHINE HCL-NALOXONE HCL 8-2 MG SL SUBL
1.0000 | SUBLINGUAL_TABLET | Freq: Every day | SUBLINGUAL | Status: DC
  Administered 2017-06-18 – 2017-06-22 (×5): 1 via SUBLINGUAL
  Filled 2017-06-18 (×5): qty 1

## 2017-06-18 MED ORDER — CEFTRIAXONE SODIUM-DEXTROSE 1-3.74 GM-% IV SOLR
1.0000 g | INTRAVENOUS | Status: DC
  Administered 2017-06-19: 1 g via INTRAVENOUS
  Filled 2017-06-18 (×2): qty 50

## 2017-06-18 MED ORDER — POTASSIUM CHLORIDE IN NACL 20-0.9 MEQ/L-% IV SOLN
INTRAVENOUS | Status: DC
  Administered 2017-06-18 – 2017-06-19 (×4): via INTRAVENOUS
  Administered 2017-06-20: 1000 mL via INTRAVENOUS
  Administered 2017-06-20 – 2017-06-22 (×3): via INTRAVENOUS
  Filled 2017-06-18 (×11): qty 1000

## 2017-06-18 MED ORDER — ACETAMINOPHEN 325 MG PO TABS: 650.0000 mg | ORAL_TABLET | Freq: Four times a day (QID) | ORAL | Status: DC | PRN

## 2017-06-18 NOTE — ED Provider Notes (Signed)
[facility] Emergency Department Provider Note  ____________________________________________  Time seen: Approximately 10:47 AM  I have reviewed the triage vital signs and the nursing notes.   HISTORY  Chief Complaint Nausea and Emesis   HPI Tammy Mcdonald is a 36 y.o. female who presents for evaluation of nausea and vomiting. Patient reports for a week she has been unable to eat or drink due to severe nausea. Has had 1 episode of nonbloody nonbilious emesis today. She denies abdominal pain, diarrhea, fever, chills. She denies alcohol use. She is on Suboxone. She endorses IV drug use but tells her that she is only used 3 times in her life with the last time being right before the onset of these symptoms. She reports injecting cocaine. She has been taking ibuprofen at home but no Tylenol according to her. No hematemesis. She denies dysuria or hematuria. No chest pain or shortness of breath.  Past Medical History:  Diagnosis Date  . Anxiety   . Hypertension     There are no active problems to display for this patient.   History reviewed. No pertinent surgical history.  Prior to Admission medications   Medication Sig Start Date End Date Taking? Authorizing Provider  propranolol (INDERAL) 40 MG tablet Take 1 tablet (40 mg total) by mouth 2 (two) times daily. Patient not taking: Reported on 06/18/2017 08/03/16   Rockne Menghini, MD    Allergies Patient has no known allergies.  No family history on file.  Social History Social History  Substance Use Topics  . Smoking status: Current Every Day Smoker    Packs/day: 1.00    Types: Cigarettes  . Smokeless tobacco: Never Used  . Alcohol use No    Review of Systems  Constitutional: Negative for fever. Eyes: Negative for visual changes. ENT: Negative for sore throat. Neck: No neck pain  Cardiovascular: Negative for chest pain. Respiratory: Negative for shortness of  breath. Gastrointestinal: Negative for abdominal pain,  Diarrhea. + N/V Genitourinary: Negative for dysuria. Musculoskeletal: Negative for back pain. Skin: Negative for rash. Neurological: Negative for headaches, weakness or numbness. Psych: No SI or HI  ____________________________________________   PHYSICAL EXAM:  VITAL SIGNS: ED Triage Vitals  Enc Vitals Group     BP 06/18/17 0852 (!) 154/104     Pulse Rate 06/18/17 0852 (!) 106     Resp 06/18/17 0852 18     Temp 06/18/17 0856 98.2 F (36.8 C)     Temp Source 06/18/17 0856 Oral     SpO2 06/18/17 0852 100 %     Weight 06/18/17 0853 115 lb (52.2 kg)     Height 06/18/17 0853  (1.626 m)     Head Circumference --      Peak Flow --      Pain Score 06/18/17 0852 0     Pain Loc --      Pain Edu? --      Excl. in GC? --     Constitutional: Alert and oriented. Well appearing and in no apparent distress. HEENT:      Head: Normocephalic and atraumatic.         Eyes: Conjunctivae are normal. Sclera is non-icteric.       Mouth/Throat: Mucous membranes are moist.       Neck: Supple with no signs of meningismus. Cardiovascular: tachycardic with regular rhythm. No murmurs, gallops, or rubs. 2+ symmetrical distal pulses are present in all extremities. No JVD. Respiratory: Normal respiratory effort. Lungs are  clear to auscultation bilaterally. No wheezes, crackles, or rhonchi.  Gastrointestinal: Soft, non tender, and non distended with positive bowel sounds. No rebound or guarding. Musculoskeletal: Nontender with normal range of motion in all extremities. No edema, cyanosis, or erythema of extremities. Neurologic: Normal speech and language. Face is symmetric. Moving all extremities. No gross focal neurologic deficits are appreciated. Skin: Several track mars on the L arm Psychiatric: Mood and affect are normal. Speech and behavior are normal.  ____________________________________________   LABS (all labs ordered are listed,  but only abnormal results are displayed)  Labs Reviewed  COMPREHENSIVE METABOLIC PANEL - Abnormal; Notable for the following:       Result Value   Potassium 3.1 (*)    Chloride 100 (*)    Glucose, Bld 118 (*)    Total Protein 8.4 (*)    AST 3,168 (*)    ALT 2,347 (*)    Alkaline Phosphatase 602 (*)    Total Bilirubin 1.3 (*)    All other components within normal limits  CBC - Abnormal; Notable for the following:    RBC 5.55 (*)    Hemoglobin 16.3 (*)    HCT 47.7 (*)    RDW 14.9 (*)    All other components within normal limits  URINALYSIS, COMPLETE (UACMP) WITH MICROSCOPIC - Abnormal; Notable for the following:    Color, Urine AMBER (*)    APPearance HAZY (*)    Hgb urine dipstick SMALL (*)    Leukocytes, UA TRACE (*)    Bacteria, UA RARE (*)    Squamous Epithelial / LPF 6-30 (*)    All other components within normal limits  URINE DRUG SCREEN, QUALITATIVE ([facility] ONLY) - Abnormal; Notable for the following:    Amphetamines, Ur Screen POSITIVE (*)    Cocaine Metabolite,Ur [place] POSITIVE (*)    Cannabinoid 50 Ng, Ur [place] POSITIVE (*)    All other components within normal limits  ACETAMINOPHEN LEVEL - Abnormal; Notable for the following:    Acetaminophen (Tylenol), Serum <10 (*)    All other components within normal limits  LIPASE, BLOOD  PROTIME-INR  HEPATITIS PANEL, ACUTE  POC URINE PREG, ED  POCT PREGNANCY, URINE   ____________________________________________  EKG  ED ECG REPORT I, Nita Sickle, the attending physician, personally viewed and interpreted this ECG.  Normal sinus rhythm, rate of 81, normal intervals, normal axis, no ST elevations or depressions.  ____________________________________________  RADIOLOGY  RUQ [place]:  . There is a tiny amount of pericholecystic fluid. The gallbladder is otherwise normal. The fluid is nonspecific and could be secondary to nearly any cause of liver failure. If there is concern for cholecystitis, a HIDA scan could further  evaluate. 2. Increased echogenicity in the liver is a nonspecific finding. Hepatic steatosis is possible. No mass identified. ____________________________________________   PROCEDURES  Procedure(s) performed: None Procedures Critical Care performed: yes  CRITICAL CARE Performed by: Nita Sickle  ?  Total critical care time: 45 min  Critical care time was exclusive of separately billable procedures and treating other patients.  Critical care was necessary to treat or prevent imminent or life-threatening deterioration.  Critical care was time spent personally by me on the following activities: development of treatment plan with patient and/or surrogate as well as nursing, discussions with consultants, evaluation of patient's response to treatment, examination of patient, obtaining history from patient or surrogate, ordering and performing treatments and interventions, ordering and review of laboratory studies, ordering and review of radiographic studies, pulse oximetry and re-evaluation  of patient's condition.  ____________________________________________   INITIAL IMPRESSION / ASSESSMENT AND PLAN / ED COURSE  36 y.o. female on Suboxone and IVDU who presents for evaluation of nausea x 1 week and 1 episode of vomiting today. Labs consistent with acute liver failure with AST 3168 and ALT 2347. Tylenol level and hepatitis panel have been ordered. RUQ [place] ordered. Drug screen pending. Will give IVF. Patient will need admission to Hospital.    _________________________ 1:40 PM on 06/18/2017 -----------------------------------------  Tylenol level negative. Hepatitis panel pending. Right upper quadrant ultrasound with no acute findings. INR is normal at 1.05. Per discussion with Dr. Tobi Bastos, gastroenterologist on call okay to admit to the hospitalist since INR is within normal limits.drug screen positive for cocaine, amphetamines, and marijuana. Patient will be admitted at this  time.  Pertinent labs & imaging results that were available during my care of the patient were reviewed by me and considered in my medical decision making (see chart for details).    ____________________________________________   FINAL CLINICAL IMPRESSION(S) / ED DIAGNOSES  Final diagnoses:  Liver failure (HCC)  Acute cystitis with hematuria  Polysubstance abuse      NEW MEDICATIONS STARTED DURING THIS VISIT:  New Prescriptions   No medications on file     Note:  This document was prepared using Dragon voice recognition software and may include unintentional dictation errors.    Don Perking, [place], MD 06/18/17 1341

## 2017-06-18 NOTE — Progress Notes (Signed)
Lab called to report HIV antibody result +(positive). Notified Dr. Anne Hahn. Infectious Disease consult ordered per Dr. Anne Hahn. Pnt unaware per Dr. Anne Hahn allow pnt to rest and primary team to talk with pnt in AM. No other issues or concerns at this time. Will continue to monitor and assess.

## 2017-06-18 NOTE — ED Notes (Signed)
RN observed patient up walking in the hall. RN inquired about where pt was going. Pt stated that she wanted to go charge her phone. RN informed pt that I could not let her go out to the lobby to charge her cell phone with her IV in. Pt stated that she wanted IV taken out so that she could go charge her phone.  RN spoke with MD and MD to bedside to speak with pt.

## 2017-06-18 NOTE — Progress Notes (Signed)
Notified by nursing of lab result positive HIV antibody.  Patient resting, will defer to daytime team to advise patient of result.  Infectious disease consult ordered.  Kristeen Miss [facility] 06/18/2017, 9:45 PM

## 2017-06-18 NOTE — Progress Notes (Signed)
Pnt reports taking suboxone daily 8/2. No orders for this medication and not recorded in hx of medications pnt has been taking in the past. Paged MD on call no return call at this time will continue to monitor.

## 2017-06-18 NOTE — H&P (Signed)
History and Physical    Tammy Mcdonald:[id number] DOB: 03/13/1981 DOA: 06/18/2017  Referring physician: Dr. Don Perking PCP: Patient, No Pcp Per  Specialists: none  Chief Complaint: abdominal pain with N/V  HPI: Tammy Mcdonald is a 36 y.o. female has a past medical history significant for HTN, anxiety, and IV drug abuse now with acute onset abdominal pain and N/V. In ER, pt's LFT's are extremely elevated. She continues ot have abdominal pain with N/V. Recent IV drug use with positive urine drug screen. She is now admitted. No fever or diarrhea. Denies CP or SOB.  Review of Systems: The patient denies anorexia, fever, weight loss,, vision loss, decreased hearing, hoarseness, chest pain, syncope, dyspnea on exertion, peripheral edema, balance deficits, hemoptysis,  melena, hematochezia, severe indigestion/heartburn, hematuria, incontinence, genital sores, muscle weakness, suspicious skin lesions, transient blindness, difficulty walking, depression, unusual weight change, abnormal bleeding, enlarged lymph nodes, angioedema, and breast masses.   Past Medical History:  Diagnosis Date  . Anxiety   . Hypertension    History reviewed. No pertinent surgical history. Social History:  reports that she has been smoking Cigarettes.  She has been smoking about 1.00 pack per day. She has never used smokeless tobacco. She reports that she does not drink alcohol or use drugs.  No Known Allergies  History reviewed. No pertinent family history.  Prior to Admission medications   Medication Sig Start Date End Date Taking? Authorizing Provider  propranolol (INDERAL) 40 MG tablet Take 1 tablet (40 mg total) by mouth 2 (two) times daily. Patient not taking: Reported on 06/18/2017 08/03/16   Rockne Menghini, MD   Physical Exam: Vitals:   06/18/17 8119 06/18/17 0853 06/18/17 0856 06/18/17 1026  BP: (!) 154/104   (!) 162/110  Pulse: (!) 106   85  Resp: 18     Temp:   98.2 F (36.8 C)    TempSrc:   Oral   SpO2: 100%   100%  Weight:  52.2 kg (115 lb)    Height:   (1.626 m)       General:  No apparent distress, WDWN, [place]/AT  Eyes: PERRL, EOMI, no scleral icterus, conjunctiva clear  ENT: moist oropharynx without exudate, TM's benign, dentition fair  Neck: supple, no lymphadenopathy. No bruits or thyromegaly  Cardiovascular: regular rate without MRG; 2+ peripheral pulses, no JVD, no peripheral edema  Respiratory: CTA biL, good air movement without wheezing, rhonchi or crackled. Respiratory effort normal  Abdomen: soft, diffusely tender to palpation, positive bowel sounds, no guarding, no rebound  Skin: no rashes or lesions  Musculoskeletal: normal bulk and tone, no joint swelling  Psychiatric: normal mood and affect, A&OX3  Neurologic: CN 2-12 grossly intact, Motor strength 5/5 in all 4 groups with symmetric DTR's and non-focal sensory exam  Labs on Admission:  Basic Metabolic Panel:  Recent Labs Lab 06/18/17 0854  NA 139  K 3.1*  CL 100*  CO2 29  GLUCOSE 118*  BUN 11  CREATININE 0.51  CALCIUM 9.5   Liver Function Tests:  Recent Labs Lab 06/18/17 0854  AST 3,168*  ALT 2,347*  ALKPHOS 602*  BILITOT 1.3*  PROT 8.4*  ALBUMIN 4.2    Recent Labs Lab 06/18/17 0854  LIPASE 25   No results for input(s): AMMONIA in the last 168 hours. CBC:  Recent Labs Lab 06/18/17 0854  WBC 7.0  HGB 16.3*  HCT 47.7*  MCV 85.8  PLT 360   Cardiac Enzymes: No results for input(s): CKTOTAL, CKMB,  CKMBINDEX, TROPONINI in the last 168 hours.  BNP (last 3 results) No results for input(s): BNP in the last 8760 hours.  ProBNP (last 3 results) No results for input(s): PROBNP in the last 8760 hours.  CBG: No results for input(s): GLUCAP in the last 168 hours.  Radiological Exams on Admission: US Abdomen Limited Ruq  Result Date: 06/18/2017 CLINICAL DATA:  Liver failure. EXAM: ULTRASOUND ABDOMEN LIMITED RIGHT UPPER QUADRANT COMPARISON:  None.  FINDINGS: Gallbladder: No stones, sludge, wall thickening, or Murphy's sign. A tiny amount of pericholecystic fluid is identified. Common bile duct: Diameter: 2.9 mm Liver: Increased hepatic echogenicity with no focal mass. Portal vein is patent on color Doppler imaging with normal direction of blood flow towards the liver. IMPRESSION: 1. There is a tiny amount of pericholecystic fluid. The gallbladder is otherwise normal. The fluid is nonspecific and could be secondary to nearly any cause of liver failure. If there is concern for cholecystitis, a HIDA scan could further evaluate. 2. Increased echogenicity in the liver is a nonspecific finding. Hepatic steatosis is possible. No mass identified. Electronically Signed   By: Gerome Sam III M.D   On: 06/18/2017 11:34    EKG: Independently reviewed.  Assessment/Plan Principal Problem:   Abnormal LFTs Active Problems:   Abdominal pain   Intractable nausea and vomiting   IV drug abuse   Will admit to floor with IV fluids and empiric IV ABX. Hepatitis serologies sent/pending. Will order [place] of abdomen and consult GI. Clear liquid diet. Repeat labs in AM  Diet: clear liquids Fluids: NS@100  DVT Prophylaxis: SQ Heparin  Code Status: FULL  Family Communication: none  Disposition Plan: home  Time spent: 5o min

## 2017-06-18 NOTE — Clinical Social Work Note (Signed)
CSW received consult for homeless issues. CSW will assess when able including discussion of active substance use.  [place] Ponder, MSW, Theresia Majors [phone]

## 2017-06-18 NOTE — Consult Note (Signed)
Wyline Mood MD, MRCP(U.K) [address]  [address]  [place], [place] [address]  Main: [phone]  Fax: [phone]  Consultation  Referring Provider:  Dr Judithann Sheen  Primary Care Physician:  Patient, No Pcp Per Primary Gastroenterologist:  Dr. Brent General          Reason for Consultation:     Abnormal LFT's   Date of Admission:  06/18/2017 Date of Consultation:  06/18/2017         HPI:   Tammy Mcdonald is a 36 y.o. female with a history of IV drug use admitted with abdominal pain earlier today , found to have elevated LFT's . She say she has been feeling sick for a week but no vomiting or diarrhea. Denies any fevers. No recent IV drug use although has in the past . Has used suboxone recently under her tongue. Denies any herbal supplements, weight loss supplements , no new tatoos, no mushrooms consumes. No recent sexual activity. Denies any pain or discomfort presently. Going through hard times after her marriage broke down after 15 years. Denies any alcohol use.   Past Medical History:  Diagnosis Date  . Anxiety   . Hypertension     Past Surgical History:  Procedure Laterality Date  . TUBAL LIGATION  2003    Prior to Admission medications   Medication Sig Start Date End Date Taking? Authorizing Provider  propranolol (INDERAL) 40 MG tablet Take 1 tablet (40 mg total) by mouth 2 (two) times daily. Patient not taking: Reported on 06/18/2017 08/03/16   Rockne Menghini, MD    Family History  Problem Relation Age of Onset  . Hypertension Mother   . Anxiety disorder Mother   . Rheumatologic disease Mother   . Hypertension Father   . Anxiety disorder Father   . Heart attack Father   . Diabetes Sister   . Hypertension Sister   . Anxiety disorder Sister      Social History  Substance Use Topics  . Smoking status: Current Every Day Smoker    Packs/day: 1.00    Types: Cigarettes  . Smokeless tobacco: Never Used  . Alcohol use No    Allergies as of 06/18/2017  . (No  Known Allergies)    Review of Systems:    All systems reviewed and negative except where noted in HPI.   Physical Exam:  Vital signs in last 24 hours: Temp:  [97.9 F (36.6 C)-98.2 F (36.8 C)] 97.9 F (36.6 C) (09/22 1529) Pulse Rate:  [72-106] 72 (09/22 1529) Resp:  [11-19] 14 (09/22 1529) BP: (154-184)/(95-114) 164/95 (09/22 1529) SpO2:  [91 %-100 %] 100 % (09/22 1529) Weight:  [115 lb (52.2 kg)] 115 lb (52.2 kg) (09/22 0853) Last BM Date: 06/17/17 General:   Pleasant, cooperative in NAD Head:  Normocephalic and atraumatic. Eyes:   No icterus.   Conjunctiva pink. PERRLA. Ears:  Normal auditory acuity. Neck:  Supple; no masses or thyroidomegaly Lungs: Respirations even and unlabored. Lungs clear to auscultation bilaterally.   No wheezes, crackles, or rhonchi.  Heart:  Regular rate and rhythm;  Without murmur, clicks, rubs or gallops Abdomen:  Soft, nondistended, nontender. Normal bowel sounds. No appreciable masses or hepatomegaly.  No rebound or guarding.  Neurologic:  Alert and oriented x3;  grossly normal neurologically. Skin:  Intact without significant lesions or rashes. Cervical Nodes:  No significant cervical adenopathy. Psych:  Alert and cooperative. Normal affect.  LAB RESULTS:  Recent Labs  06/18/17 0854  WBC 7.0  HGB  16.3*  HCT 47.7*  PLT 360   BMET  Recent Labs  06/18/17 0854  NA 139  K 3.1*  CL 100*  CO2 29  GLUCOSE 118*  BUN 11  CREATININE 0.51  CALCIUM 9.5   LFT  Recent Labs  06/18/17 0854  PROT 8.4*  ALBUMIN 4.2  AST 3,168*  ALT 2,347*  ALKPHOS 602*  BILITOT 1.3*   PT/INR  Recent Labs  06/18/17 1134  LABPROT 13.6  INR 1.05    STUDIES: US Abdomen Limited Ruq  Result Date: 06/18/2017 CLINICAL DATA:  Liver failure. EXAM: ULTRASOUND ABDOMEN LIMITED RIGHT UPPER QUADRANT COMPARISON:  None. FINDINGS: Gallbladder: No stones, sludge, wall thickening, or Murphy's sign. A tiny amount of pericholecystic fluid is identified. Common  bile duct: Diameter: 2.9 mm Liver: Increased hepatic echogenicity with no focal mass. Portal vein is patent on color Doppler imaging with normal direction of blood flow towards the liver. IMPRESSION: 1. There is a tiny amount of pericholecystic fluid. The gallbladder is otherwise normal. The fluid is nonspecific and could be secondary to nearly any cause of liver failure. If there is concern for cholecystitis, a HIDA scan could further evaluate. 2. Increased echogenicity in the liver is a nonspecific finding. Hepatic steatosis is possible. No mass identified. Electronically Signed   By: Gerome Sam III M.D   On: 06/18/2017 11:34      Impression / Plan:   ONDA Mcdonald is a 36 y.o. y/o female with acute hepatitis. With the degree if elevated liver enzymes usually seen in acute viral hepatitis, or in ischemic hepatitis or due to toxins. At this time no evidence of liver failure  Plan  1. Watch mental status closely. If altered page me immediately. Check INR daily . A rising INR suggests liver failure   2. Acute viral hepatitis panel with EBV,HSV,CMV,VZV,HIV serology , doppler USG of liver, tylenol levels, autoimmune panel.   3. IF abdominal pain persists or recurs get HIDA scan    Thank you for involving me in the care of this patient.      LOS: 0 days   Wyline Mood, MD  06/18/2017, 4:48 PM

## 2017-06-18 NOTE — Progress Notes (Signed)
Per Dr. Amado Coe prime doc okay to make patient NPO after midnight for ultrasound in the am.

## 2017-06-18 NOTE — Progress Notes (Signed)
Per Dr. Everette Rank to place order for Nicotine Patch .

## 2017-06-18 NOTE — ED Triage Notes (Signed)
Pt reports 2 nights ago beginning to have nausea. Pt states that it has been intermittent for a couple days and this am she vomited x 1. Pt denies abd pain/diarrhea.

## 2017-06-18 NOTE — ED Notes (Signed)
Pt states she has also been having hot flashes the last few days

## 2017-06-19 ENCOUNTER — Inpatient Hospital Stay: Payer: Self-pay

## 2017-06-19 DIAGNOSIS — R945 Abnormal results of liver function studies: Secondary | ICD-10-CM

## 2017-06-19 LAB — COMPREHENSIVE METABOLIC PANEL
ALT: 2211 U/L — ABNORMAL HIGH (ref 14–54)
AST: 2864 U/L — AB (ref 15–41)
Albumin: 3.3 g/dL — ABNORMAL LOW (ref 3.5–5.0)
Alkaline Phosphatase: 524 U/L — ABNORMAL HIGH (ref 38–126)
Anion gap: 8 (ref 5–15)
BUN: 5 mg/dL — ABNORMAL LOW (ref 6–20)
CHLORIDE: 106 mmol/L (ref 101–111)
CO2: 23 mmol/L (ref 22–32)
Calcium: 8.7 mg/dL — ABNORMAL LOW (ref 8.9–10.3)
Creatinine, Ser: 0.49 mg/dL (ref 0.44–1.00)
Glucose, Bld: 93 mg/dL (ref 65–99)
POTASSIUM: 4.3 mmol/L (ref 3.5–5.1)
SODIUM: 137 mmol/L (ref 135–145)
TOTAL PROTEIN: 6.4 g/dL — AB (ref 6.5–8.1)
Total Bilirubin: 2 mg/dL — ABNORMAL HIGH (ref 0.3–1.2)

## 2017-06-19 LAB — CBC
HEMATOCRIT: 44.9 % (ref 35.0–47.0)
Hemoglobin: 14.9 g/dL (ref 12.0–16.0)
MCH: 28.6 pg (ref 26.0–34.0)
MCHC: 33.2 g/dL (ref 32.0–36.0)
MCV: 86.3 fL (ref 80.0–100.0)
PLATELETS: 273 10*3/uL (ref 150–440)
RBC: 5.2 MIL/uL (ref 3.80–5.20)
RDW: 15.4 % — AB (ref 11.5–14.5)
WBC: 9.4 10*3/uL (ref 3.6–11.0)

## 2017-06-19 LAB — PROTIME-INR
INR: 1.06
Prothrombin Time: 13.7 seconds (ref 11.4–15.2)

## 2017-06-19 LAB — GLUCOSE, CAPILLARY
GLUCOSE-CAPILLARY: 68 mg/dL (ref 65–99)
GLUCOSE-CAPILLARY: 68 mg/dL (ref 65–99)
Glucose-Capillary: 71 mg/dL (ref 65–99)
Glucose-Capillary: 97 mg/dL (ref 65–99)

## 2017-06-19 MED ORDER — DEXTROSE 5 % IV SOLN
1.0000 g | INTRAVENOUS | Status: DC
  Administered 2017-06-20 – 2017-06-22 (×3): 1 g via INTRAVENOUS
  Filled 2017-06-19 (×3): qty 10

## 2017-06-19 NOTE — Progress Notes (Signed)
Hypoglycemic Event  CBG: 68  Treatment: apple juice  Symptoms: none  Follow-up CBG: Time: 1259 CBG Result:97  Possible Reasons for Event: pt did not wanted to eat this morning. She was feeling nauseous.   Comments/MD notified: no    Suzzanne Cloud

## 2017-06-19 NOTE — Progress Notes (Signed)
Wyline Mood MD, MRCP(U.K) [address]  [address]  [place], [place] [address]  Main: [phone]    Tammy Mcdonald is being followed for abnormal LFT's  Day 1 of follow up   Subjective: No new complaints or pain    Objective: Vital signs in last 24 hours: Vitals:   06/18/17 1529 06/18/17 2033 06/19/17 0236 06/19/17 0421  BP: (!) 164/95 (!) 172/105  (!) 143/94  Pulse: 72 81  77  Resp: Temp: 97.9 F (36.6 C) 98.1 F (36.7 C)  97.9 F (36.6 C)  TempSrc: Oral Oral  Oral  SpO2: 100% 100%  100%  Weight:   110 lb 4.8 oz (50 kg)   Height:       Weight change:   Intake/Output Summary (Last 24 hours) at 06/19/17 1004 Last data filed at 06/19/17 0730  Gross per 24 hour  Intake             1838 ml  Output             1400 ml  Net              438 ml     Exam: Heart:: Regular rate and rhythm, S1S2 present or without murmur or extra heart sounds Lungs: normal, clear to auscultation and clear to auscultation and percussion Abdomen: soft, nontender, normal bowel sounds   Lab Results: @ Micro Results: No results found for this or any previous visit (from the past 240 hour(s)). Studies/Results: US Abdomen Limited Ruq  Result Date: 06/18/2017 CLINICAL DATA:  Liver failure. EXAM: ULTRASOUND ABDOMEN LIMITED RIGHT UPPER QUADRANT COMPARISON:  None. FINDINGS: Gallbladder: No stones, sludge, wall thickening, or Murphy's sign. A tiny amount of pericholecystic fluid is identified. Common bile duct: Diameter: 2.9 mm Liver: Increased hepatic echogenicity with no focal mass. Portal vein is patent on color Doppler imaging with normal direction of blood flow towards the liver. IMPRESSION: 1. There is a tiny amount of pericholecystic fluid. The gallbladder is otherwise normal. The fluid is nonspecific and could be secondary to nearly any cause of liver failure. If there is concern for cholecystitis, a HIDA scan could further evaluate. 2. Increased echogenicity in  the liver is a nonspecific finding. Hepatic steatosis is possible. No mass identified. Electronically Signed   By: Gerome Sam III M.D   On: 06/18/2017 11:34   Medications: I have reviewed the patient's current medications. Scheduled Meds: . buprenorphine-naloxone  1 tablet Sublingual Daily  . cefTRIAXone  1 g Intravenous Q24H  . docusate sodium  100 mg Oral BID  . nicotine  21 mg Transdermal Daily  . pantoprazole (PROTONIX) IV  40 mg Intravenous Q12H  . propranolol  40 mg Oral BID   Continuous Infusions: . 0.9 % NaCl with KCl 20 mEq / L 100 mL/hr at 06/19/17 0943   PRN Meds:.bisacodyl, hydrALAZINE, LORazepam, ondansetron **OR** ondansetron (ZOFRAN) IV   Assessment: Principal Problem:   Abnormal LFTs Active Problems:   Abdominal pain   Intractable nausea and vomiting   IV drug abuse Tammy Mcdonald is a 36 y.o. y/o female with acute hepatitis. With the degree if elevated liver enzymes usually seen in acute viral hepatitis, or in ischemic hepatitis or due to toxins. At this time no evidence of liver failure. LFT's improved since yesterday   Plan  1. Watch mental status closely. If altered page me immediately. Check INR daily . A rising INR suggests liver failure   2.  F/u Acute viral hepatitis panel with EBV,HSV,CMV,VZV,HIV serology , doppler USG of liver, tylenol levels, autoimmune panel.   3. IF abdominal pain recurs get HIDA scan   4. HIV antibody test is positive- ID consult      LOS: 1 day   Wyline Mood 06/19/2017, 10:04 AM

## 2017-06-19 NOTE — Progress Notes (Signed)
Per Dr. Juliene Pina okay to place order for clear liquids as pt is nauseous at this time.

## 2017-06-19 NOTE — Progress Notes (Signed)
Per Dr. Juliene Pina okay to place pt on soft diet.

## 2017-06-19 NOTE — Progress Notes (Signed)
Pnt resting no issues or concerns overnight. Pnt aware of NPO status and verbalizes understanding. Will continue to monitor and assess.

## 2017-06-19 NOTE — Progress Notes (Signed)
Hypoglycemic Event  CBG:68   Treatment: apple juice  Symptoms: none  Follow-up CBG: Time:0929 CBG Result:71  Possible Reasons for Event: pt did not wanted to eat. Took longer than 15 for her to drink the apple juice.  Comments/MD notified:MD rounding at beside     Suzzanne Cloud

## 2017-06-19 NOTE — Clinical Social Work Note (Signed)
Clinical Social Work Assessment  Patient Details  Name: Tammy Mcdonald MRN: [id number] Date of Birth: [birth year]  Date of referral:  06/19/17               Reason for consult:  Housing Concerns/Homelessness                Permission sought to share information with:    Permission granted to share information::     Name::        Agency::     Relationship::     Contact Information:     Housing/Transportation Living arrangements for the past 2 months:  Homeless Source of Information:  Patient Patient Interpreter Needed:  None Criminal Activity/Legal Involvement Pertinent to Current Situation/Hospitalization:  No - Comment as needed Significant Relationships:  Significant Other, Parents Lives with:  Self Do you feel safe going back to the place where you live?  Yes Need for family participation in patient care:  No (Coment)  Care giving concerns:  Patient requested information about housing resources.   Social Worker assessment / plan:  CSW met with the patient at bedside to provide housing resources as well as information about substance use resources, food banks, and vocational rehabilitation. The patient has support from her father and her boyfriend. The patient reports a hx of polysubstance use and a desire to enter recovery. Included in the resource list are options for residential treatment. The CSW empowered the patient to contact the resources during regular business hours. The CSW also provided emotional support concerning the patient's new diagnosis of HIV and viral hepatitis.   Employment status:  Unemployed Forensic scientist:  Self Pay (Medicaid Pending) PT Recommendations:  Not assessed at this time Information / Referral to community resources:  Outpatient Substance Abuse Treatment Options  Patient/Family's Response to care:  The patient thanked the CSW for care.  Patient/Family's Understanding of and Emotional Response to Diagnosis, Current Treatment, and  Prognosis:  The patient seems to be processing her new diagnosis and has options for discharge to pursue.  Emotional Assessment Appearance:  Appears stated age Attitude/Demeanor/Rapport:   (Pleasant) Affect (typically observed):  Pleasant Orientation:  Oriented to Self, Oriented to Place, Oriented to  Time, Oriented to Situation Alcohol / Substance use:  Illicit Drugs (Cocaine (injection); polysubstance use) Psych involvement (Current and /or in the community):  No (Comment)  Discharge Needs  Concerns to be addressed:  Adjustment to Illness, Care Coordination, Discharge Planning Concerns Readmission within the last 30 days:  No Current discharge risk:  Chronically ill, Homeless, Inadequate Financial Supports, Substance Abuse Barriers to Discharge:  Continued Medical Work up   [employer], LCSW 06/19/2017, 4:57 PM

## 2017-06-19 NOTE — Progress Notes (Signed)
[facility] - [place] [facility]   PATIENT NAME: Tammy Mcdonald    MR#:  [id number]  DATE OF BIRTH:  [birth year]  SUBJECTIVE:   Patient with some nausea this morning. Receive Zofran which is helping.  REVIEW OF SYSTEMS:    Review of Systems  Constitutional: Negative for fever, chills weight loss HENT: Negative for ear pain, nosebleeds, congestion, facial swelling, rhinorrhea, neck pain, neck stiffness and ear discharge.   Respiratory: Negative for cough, shortness of breath, wheezing  Cardiovascular: Negative for chest pain, palpitations and leg swelling.  Gastrointestinal: Positive for nausea Genitourinary: Negative for dysuria, urgency, frequency, hematuria Musculoskeletal: Negative for back pain or joint pain Neurological: Negative for dizziness, seizures, syncope, focal weakness,  numbness and headaches.  Hematological: Does not bruise/bleed easily.  Psychiatric/Behavioral: Negative for hallucinations, confusion, dysphoric mood    Tolerating Diet: Clear liquid      DRUG ALLERGIES:  No Known Allergies  VITALS:  Blood pressure (!) 143/94, pulse 77, temperature 97.9 F (36.6 C), temperature source Oral, resp. rate 16, height  (1.626 m), weight 50 kg (110 lb 4.8 oz), last menstrual period 06/11/2017, SpO2 100 %.  PHYSICAL EXAMINATION:  Constitutional: Appears well-developed and well-nourished. No distress. HENT: Normocephalic. Marland Kitchen Oropharynx is clear and moist.  Eyes: Conjunctivae and EOM are normal. PERRLA, no scleral icterus.  Neck: Normal ROM. Neck supple. No JVD. No tracheal deviation. CVS: RRR, S1/S2 +, no murmurs, no gallops, no carotid bruit.  Pulmonary: Effort and breath sounds normal, no stridor, rhonchi, wheezes, rales.  Abdominal: Soft. BS +,  no distension, tenderness, rebound or guarding.  Musculoskeletal: Normal range of motion. No edema and no tenderness.  Neuro: Alert. CN 2-12 grossly intact. No focal deficits. Skin: Skin is warm  and dry. No rash noted. Psychiatric: Normal mood and affect.      LABORATORY PANEL:   CBC  Recent Labs Lab 06/18/17 0854  WBC 7.0  HGB 16.3*  HCT 47.7*  PLT 360   ------------------------------------------------------------------------------------------------------------------  Chemistries   Recent Labs Lab 06/19/17 0540  NA 137  K 4.3  CL 106  CO2 23  GLUCOSE 93  BUN 5*  CREATININE 0.49  CALCIUM 8.7*  AST 2,864*  ALT 2,211*  ALKPHOS 524*  BILITOT 2.0*   ------------------------------------------------------------------------------------------------------------------  Cardiac Enzymes No results for input(s): TROPONINI in the last 168 hours. ------------------------------------------------------------------------------------------------------------------  RADIOLOGY:  US Abdomen Limited Ruq  Result Date: 06/18/2017 CLINICAL DATA:  Liver failure. EXAM: ULTRASOUND ABDOMEN LIMITED RIGHT UPPER QUADRANT COMPARISON:  None. FINDINGS: Gallbladder: No stones, sludge, wall thickening, or Murphy's sign. A tiny amount of pericholecystic fluid is identified. Common bile duct: Diameter: 2.9 mm Liver: Increased hepatic echogenicity with no focal mass. Portal vein is patent on color Doppler imaging with normal direction of blood flow towards the liver. IMPRESSION: 1. There is a tiny amount of pericholecystic fluid. The gallbladder is otherwise normal. The fluid is nonspecific and could be secondary to nearly any cause of liver failure. If there is concern for cholecystitis, a HIDA scan could further evaluate. 2. Increased echogenicity in the liver is a nonspecific finding. Hepatic steatosis is possible. No mass identified. Electronically Signed   By: Gerome Sam III M.D   On: 06/18/2017 11:34     ASSESSMENT AND PLAN:    36 year old female with polysubstance abuse who presented with abdominal pain, nausea and vomiting and found to have elevated liver function tests.  1.  Elevated liver function tests which are now decreasing with nausea, and vomiting and  abdominal pain: This is most likely due to acute viral hepatitis Continue to monitor LFTs and INR Right upper quadrant ultrasound shows no acute pathology Follow-up on today's Doppler ultrasound which was ordered of the abdomen Appreciate GI evaluation Follow-up on acute hepatitis panel, HSV, CMV, varicella-zoster, EBV, Tylenol level within normal limits  2. HIV positive: This is a new diagnosis Discussed with patient ID consultation placed CD4 count ordered  3. IV drug use: Patient was counseled Continue Suboxone 4. Tobacco dependence: Patient is encouraged to quit smoking. Counseling was provided for 4 minutes. Continue nicotine patch  5. UTI: Start Rocephin and follow up in urine culture  6. Essential hypertension on propanolol  Management plans discussed with the patient and she is in agreement.  CODE STATUS: full  TOTAL TIME TAKING CARE OF THIS PATIENT: 30 minutes.     POSSIBLE D/C 2-3 days, DEPENDING ON CLINICAL CONDITION.   Marrion Finan M.D on 06/19/2017 at 8:37 AM  Between 7am to 6pm - Pager - [phone] After 6pm go to [email or web address] - password [email or web address]  [facility]  Office  [phone]  CC: Primary care physician; Patient, No Pcp Per  Note: This dictation was prepared with Dragon dictation along with smaller phrase technology. Any transcriptional errors that result from this process are unintentional.

## 2017-06-20 LAB — COMPREHENSIVE METABOLIC PANEL
ALK PHOS: 448 U/L — AB (ref 38–126)
ALT: 1895 U/L — AB (ref 14–54)
AST: 2263 U/L — AB (ref 15–41)
Albumin: 2.8 g/dL — ABNORMAL LOW (ref 3.5–5.0)
Anion gap: 3 — ABNORMAL LOW (ref 5–15)
BUN: 7 mg/dL (ref 6–20)
CALCIUM: 8.3 mg/dL — AB (ref 8.9–10.3)
CHLORIDE: 111 mmol/L (ref 101–111)
CO2: 24 mmol/L (ref 22–32)
CREATININE: 0.54 mg/dL (ref 0.44–1.00)
Glucose, Bld: 98 mg/dL (ref 65–99)
Potassium: 4.5 mmol/L (ref 3.5–5.1)
Sodium: 138 mmol/L (ref 135–145)
Total Bilirubin: 2.3 mg/dL — ABNORMAL HIGH (ref 0.3–1.2)
Total Protein: 5.8 g/dL — ABNORMAL LOW (ref 6.5–8.1)

## 2017-06-20 LAB — PROTIME-INR
INR: 1.17
PROTHROMBIN TIME: 14.8 s (ref 11.4–15.2)

## 2017-06-20 LAB — GLUCOSE, CAPILLARY: Glucose-Capillary: 81 mg/dL (ref 65–99)

## 2017-06-20 MED ORDER — PANTOPRAZOLE SODIUM 40 MG PO TBEC
40.0000 mg | DELAYED_RELEASE_TABLET | Freq: Two times a day (BID) | ORAL | Status: DC
  Administered 2017-06-20 – 2017-06-22 (×5): 40 mg via ORAL
  Filled 2017-06-20 (×5): qty 1
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
## 2017-06-20 NOTE — Progress Notes (Signed)
LFT's improving , still awaiting labs which were ordered in process  Dr Wyline Mood MD,MRCP [facility]) Gastroenterology/Hepatology Pager: [phone]

## 2017-06-20 NOTE — Progress Notes (Signed)
PHARMACIST - PHYSICIAN COMMUNICATION  DR:   Imogene Burn  CONCERNING: IV to Oral Route Change Policy  RECOMMENDATION: This patient is receiving pantoprazole by the intravenous route.  Based on criteria approved by the Pharmacy and Therapeutics Committee, the intravenous medication(s) is/are being converted to the equivalent oral dose form(s).   DESCRIPTION: These criteria include:  The patient is eating (either orally or via tube) and/or has been taking other orally administered medications for a least 24 hours  The patient has no evidence of active gastrointestinal bleeding or impaired GI absorption (gastrectomy, short bowel, patient on TNA or NPO).  If you have questions about this conversion, please contact the Pharmacy Department  Cindi Carbon, [facility] 06/20/2017 10:03 AM

## 2017-06-20 NOTE — Progress Notes (Addendum)
Pt's visitor (friend of pt's mother) informed this RN that while pt's mother stepped out of room, pt took an orange pill out of her mothers purse to take. Visitor believes that it is Bupropion and that pt may have visitors at night bringing in other medications.

## 2017-06-20 NOTE — Progress Notes (Signed)
[employer] [facility] - [place] at [facility]   PATIENT NAME: Tammy Mcdonald    MR#:  [id number]  DATE OF BIRTH:  [birth year]  SUBJECTIVE:   Patient with some RUQ pain and nausea.  REVIEW OF SYSTEMS:    Review of Systems  Constitutional: Negative for fever, chills weight loss HENT: Negative for ear pain, nosebleeds, congestion, facial swelling, rhinorrhea, neck pain, neck stiffness and ear discharge.   Respiratory: Negative for cough, shortness of breath, wheezing  Cardiovascular: Negative for chest pain, palpitations and leg swelling.  Gastrointestinal: Positive for nausea and RUQ pain. Genitourinary: Negative for dysuria, urgency, frequency, hematuria Musculoskeletal: Negative for back pain or joint pain Neurological: Negative for dizziness, seizures, syncope, focal weakness,  numbness and headaches.  Hematological: Does not bruise/bleed easily.  Psychiatric/Behavioral: Negative for hallucinations, confusion, dysphoric mood  DRUG ALLERGIES:  No Known Allergies  VITALS:  Blood pressure 111/70, pulse 62, temperature 97.7 F (36.5 C), temperature source Oral, resp. rate 18, height  (1.626 m), weight 109 lb (49.4 kg), last menstrual period 06/11/2017, SpO2 99 %.  PHYSICAL EXAMINATION:  Constitutional: Appears well-developed and well-nourished. No distress. HENT: Normocephalic. Marland Kitchen Oropharynx is clear and moist.  Eyes: Conjunctivae and EOM are normal. PERRLA, no scleral icterus.  Neck: Normal ROM. Neck supple. No JVD. No tracheal deviation. CVS: RRR, S1/S2 +, no murmurs, no gallops, no carotid bruit.  Pulmonary: Effort and breath sounds normal, no stridor, rhonchi, wheezes, rales.  Abdominal: Soft. BS +,  no distension, RUQ tenderness, no rebound or guarding.  Musculoskeletal: Normal range of motion. No edema and no tenderness.  Neuro: Alert. CN 2-12 grossly intact. No focal deficits. Skin: Skin is warm and dry. No rash noted. Psychiatric: Normal mood and affect.   LABORATORY PANEL:   CBC  Recent Labs Lab 06/19/17 0540  WBC 9.4  HGB 14.9  HCT 44.9  PLT 273   ------------------------------------------------------------------------------------------------------------------  Chemistries   Recent Labs Lab 06/20/17 0436  NA 138  K 4.5  CL 111  CO2 24  GLUCOSE 98  BUN 7  CREATININE 0.54  CALCIUM 8.3*  AST 2,263*  ALT 1,895*  ALKPHOS 448*  BILITOT 2.3*   ------------------------------------------------------------------------------------------------------------------  Cardiac Enzymes No results for input(s): TROPONINI in the last 168 hours. ------------------------------------------------------------------------------------------------------------------  RADIOLOGY:  US Abdominal Pelvic Art/vent Flow Doppler  Result Date: 06/19/2017 CLINICAL DATA:  Hepatic failure with markedly elevated liver function tests. EXAM: DUPLEX ULTRASOUND OF LIVER TECHNIQUE: Color and duplex Doppler ultrasound was performed to evaluate the hepatic in-flow and out-flow vessels. COMPARISON:  Abdominal ultrasound on 06/18/2017 FINDINGS: Portal Vein Velocities Main:  39 cm/sec Right:  20 cm/sec Left:  13 cm/sec Hepatic Vein Velocities Right:  18 cm/sec Middle:  23 cm/sec Left:  18 cm/sec Hepatic Artery Velocity:  63 cm/sec Splenic Vein Velocity:  9 cm/sec Varices: None visualized. Ascites: None visualized. No evidence of portal vein thrombus. The spleen is not enlarged. No abnormal waveforms. IMPRESSION: Unremarkable hepatic duplex ultrasound demonstrating no evidence of portal vein thrombosis, hepatic veno-occlusive disease or portal hypertension. Electronically Signed   By: Irish Lack M.D.   On: 06/19/2017 13:50     ASSESSMENT AND PLAN:    36 year old female with polysubstance abuse who presented with abdominal pain, nausea and vomiting and found to have elevated liver function tests.  1. Elevated liver function tests which are now decreasing with  nausea, and vomiting and abdominal pain: This is most likely due to acute viral hepatitis Continue to monitor LFTs and INR Right upper quadrant  ultrasound shows no acute pathology Unremarkable Doppler ultrasound  Follow-up on acute hepatitis panel, HSV, CMV, varicella-zoster, EBV, Tylenol level within normal limits  2. HIV positive: This is a new diagnosis Discussed with patient Follow-up ID consult and CD4.  3. IV drug use: Patient was counseled Continue Suboxone  4. Tobacco dependence: Patient is encouraged to quit smoking. Counseling was provided for 4 minutes. Continue nicotine patch  5. UTI: Continue Rocephin and follow up in urine culture  6. Essential hypertension on propanolol  Management plans discussed with the patient, her mother (upon the patient's permission) and they are in agreement.  CODE STATUS: full  TOTAL TIME TAKING CARE OF THIS PATIENT: 37 minutes.   POSSIBLE D/C 2-3 days, DEPENDING ON CLINICAL CONDITION.   Shaune Pollack M.D on 06/20/2017 at 2:27 PM  Between 7am to 6pm - Pager - [phone] After 6pm go to [email or web address] - password [email or web address]  [facility] [place] [facility]  Office  [phone]  CC: Primary care physician; Patient, No Pcp Per  Note: This dictation was prepared with Dragon dictation along with smaller phrase technology. Any transcriptional errors that result from this process are unintentional.

## 2017-06-21 LAB — GLUCOSE, CAPILLARY: Glucose-Capillary: 88 mg/dL (ref 65–99)

## 2017-06-21 LAB — HELPER T-LYMPH-CD4 (ARMC ONLY)
% CD 4 Pos. Lymph.: 48.1 % (ref 30.8–58.5)
ABSOLUTE CD 4 HELPER: 1491 /uL (ref 359–1519)
BASOS: 1 %
Basophils Absolute: 0.1 10*3/uL (ref 0.0–0.2)
EOS (ABSOLUTE): 0.3 10*3/uL (ref 0.0–0.4)
Eos: 3 %
HEMATOCRIT: 47 % — AB (ref 34.0–46.6)
HEMOGLOBIN: 15.5 g/dL (ref 11.1–15.9)
Immature Grans (Abs): 0.1 10*3/uL (ref 0.0–0.1)
Immature Granulocytes: 1 %
LYMPHS ABS: 3.1 10*3/uL (ref 0.7–3.1)
LYMPHS: 33 %
MCH: 29 pg (ref 26.6–33.0)
MCHC: 33 g/dL (ref 31.5–35.7)
MCV: 88 fL (ref 79–97)
Monocytes Absolute: 1 10*3/uL — ABNORMAL HIGH (ref 0.1–0.9)
Monocytes: 10 %
NEUTROS PCT: 52 %
Neutrophils Absolute: 5 10*3/uL (ref 1.4–7.0)
PLATELETS: 204 10*3/uL (ref 150–379)
RBC: 5.35 x10E6/uL — AB (ref 3.77–5.28)
RDW: 16.3 % — ABNORMAL HIGH (ref 12.3–15.4)
WBC: 9.5 10*3/uL (ref 3.4–10.8)

## 2017-06-21 LAB — HEPATITIS B DNA, ULTRAQUANTITATIVE, PCR
HBV DNA SERPL PCR-ACNC: NOT DETECTED [IU]/mL
HBV DNA SERPL PCR-LOG IU: UNDETERMINED log10 IU/mL

## 2017-06-21 LAB — HEPATITIS PANEL, ACUTE
HCV Ab: 0.2 s/co ratio (ref 0.0–0.9)
HEP A IGM: NEGATIVE
HEP B C IGM: POSITIVE — AB
HEP B S AG: NEGATIVE
Hep A IgM: NEGATIVE
Hep B C IgM: POSITIVE — AB
Hepatitis B Surface Ag: NEGATIVE

## 2017-06-21 LAB — HEPATITIS B E ANTIBODY: Hep B E Ab: NEGATIVE

## 2017-06-21 LAB — HEPATIC FUNCTION PANEL
ALBUMIN: 2.7 g/dL — AB (ref 3.5–5.0)
ALK PHOS: 402 U/L — AB (ref 38–126)
ALT: 1309 U/L — ABNORMAL HIGH (ref 14–54)
AST: 1293 U/L — ABNORMAL HIGH (ref 15–41)
BILIRUBIN INDIRECT: 0.8 mg/dL (ref 0.3–0.9)
BILIRUBIN TOTAL: 2.1 mg/dL — AB (ref 0.3–1.2)
Bilirubin, Direct: 1.3 mg/dL — ABNORMAL HIGH (ref 0.1–0.5)
Total Protein: 5.5 g/dL — ABNORMAL LOW (ref 6.5–8.1)

## 2017-06-21 LAB — HIV 1/2 AB DIFFERENTIATION
HIV 1 Ab: POSITIVE — AB
HIV 2 AB: NEGATIVE

## 2017-06-21 LAB — HEPATITIS B E ANTIGEN: HEP B E AG: NEGATIVE

## 2017-06-21 LAB — PROTIME-INR
INR: 1.06
PROTHROMBIN TIME: 13.7 s (ref 11.4–15.2)

## 2017-06-21 LAB — HIV ANTIBODY (ROUTINE TESTING W REFLEX): HIV Screen 4th Generation wRfx: REACTIVE — AB

## 2017-06-21 NOTE — Progress Notes (Signed)
CH responded to a PG. Pt mother was in the hallway and needing support in understanding Pt condition and how to best protect herself while caring for her. CH suggested talking to the MD as to the precautions that are needed. CH offered prayer. CH is available for follow up as needed.    06/21/17 1200  Clinical Encounter Type  Visited With Family  Visit Type Initial;Spiritual support  Referral From Nurse  Consult/Referral To Chaplain  Spiritual Encounters  Spiritual Needs Prayer;Emotional

## 2017-06-21 NOTE — Progress Notes (Signed)
[employer] - [place] at [facility]   PATIENT NAME: Tammy Mcdonald    MR#:  [id number]  DATE OF BIRTH:  1981-07-04  SUBJECTIVE:   Patient with some RUQ pain and nausea.  REVIEW OF SYSTEMS:    Review of Systems  Constitutional: Negative for fever, chills weight loss HENT: Negative for ear pain, nosebleeds, congestion, facial swelling, rhinorrhea, neck pain, neck stiffness and ear discharge.   Respiratory: Negative for cough, shortness of breath, wheezing  Cardiovascular: Negative for chest pain, palpitations and leg swelling.  Gastrointestinal: Positive for nausea and RUQ pain. Genitourinary: Negative for dysuria, urgency, frequency, hematuria Musculoskeletal: Negative for back pain or joint pain Neurological: Negative for dizziness, seizures, syncope, focal weakness,  numbness and headaches.  Hematological: Does not bruise/bleed easily.  Psychiatric/Behavioral: Negative for hallucinations, confusion, dysphoric mood  DRUG ALLERGIES:  No Known Allergies  VITALS:  Blood pressure 117/67, pulse 62, temperature 98 F (36.7 C), temperature source Oral, resp. rate 20, height  (1.626 m), weight 110 lb 6.4 oz (50.1 kg), last menstrual period 06/11/2017, SpO2 99 %.  PHYSICAL EXAMINATION:  Constitutional: Appears well-developed and well-nourished. No distress. HENT: Normocephalic. Marland Kitchen Oropharynx is clear and moist.  Eyes: Conjunctivae and EOM are normal. PERRLA, no scleral icterus.  Neck: Normal ROM. Neck supple. No JVD. No tracheal deviation. CVS: RRR, S1/S2 +, no murmurs, no gallops, no carotid bruit.  Pulmonary: Effort and breath sounds normal, no stridor, rhonchi, wheezes, rales.  Abdominal: Soft. BS +,  no distension, RUQ tenderness, no rebound or guarding.  Musculoskeletal: Normal range of motion. No edema and no tenderness.  Neuro: Alert. CN 2-12 grossly intact. No focal deficits. Skin: Skin is warm and dry. No rash noted. Psychiatric: Normal mood and affect.   LABORATORY PANEL:   CBC  Recent Labs Lab 06/19/17 1133  WBC 9.5  HGB 15.5  HCT 47.0*  PLT 204   ------------------------------------------------------------------------------------------------------------------  Chemistries   Recent Labs Lab 06/20/17 0436 06/21/17 0435  NA 138  --   K 4.5  --   CL 111  --   CO2 24  --   GLUCOSE 98  --   BUN 7  --   CREATININE 0.54  --   CALCIUM 8.3*  --   AST 2,263* 1,293*  ALT 1,895* 1,309*  ALKPHOS 448* 402*  BILITOT 2.3* 2.1*   ------------------------------------------------------------------------------------------------------------------  Cardiac Enzymes No results for input(s): TROPONINI in the last 168 hours. ------------------------------------------------------------------------------------------------------------------  RADIOLOGY:  No results found.   ASSESSMENT AND PLAN:    36 year old female with polysubstance abuse who presented with abdominal pain, nausea and vomiting and found to have elevated liver function tests.  1. Elevated liver function tests, likely due to acute viral hepatitis (Hepatitis B) Improving. Continue to monitor LFT. INR is normal. Right upper quadrant ultrasound shows no acute pathology Unremarkable Doppler ultrasound  Follow-up HSV, CMV, varicella-zoster, EBV, Tylenol level within normal limits  2. HIV positive: This is a new diagnosis Discussed with patient Follow-up ID consult and CD4.  3. IV drug use: Patient was counseled Continue Suboxone  4. Tobacco dependence: Patient is encouraged to quit smoking. Counseling was provided for 4 minutes. Continue nicotine patch  5. UTI: Continue Rocephin and follow up in urine culture (still no report).  6. Essential hypertension on propanolol  Management plans discussed with the patient, her mother (upon the patient's permission) and they are in agreement.  CODE STATUS: full  TOTAL TIME TAKING CARE OF THIS PATIENT: 36 minutes.    POSSIBLE  D/C 2 days, DEPENDING ON CLINICAL CONDITION.   Shaune Pollack M.D on 06/21/2017 at 2:08 PM  Between 7am to 6pm - Pager - [phone] After 6pm go to [email or web address] - password [email or web address]  [facility] [place] [facility]  Office  [phone]  CC: Primary care physician; Patient, No Pcp Per  Note: This dictation was prepared with Dragon dictation along with smaller phrase technology. Any transcriptional errors that result from this process are unintentional.

## 2017-06-21 NOTE — Progress Notes (Signed)
Jonathon Bellows MD, MRCP(U.K) [address]  [place]  [place], [place] [address]  Main: [place] is being followed for acute hepatitis    Subjective: No new complaints    Objective: Vital signs in last 24 hours: Vitals:   06/20/17 1319 06/20/17 2123 06/21/17 0500 06/21/17 0848  BP: 111/70 137/79  137/87  Pulse: 62 (!) 58  60  Resp:  18  20  Temp: 97.7 F (36.5 C) 98.3 F (36.8 C)  98 F (36.7 C)  TempSrc: Oral Oral  Oral  SpO2: 99% 100%  99%  Weight:   110 lb 6.4 oz (50.1 kg)   Height:       Weight change: 1 lb 6.4 oz (0.635 kg)  Intake/Output Summary (Last 24 hours) at 06/21/17 0919 Last data filed at 06/21/17 0546  Gross per 24 hour  Intake             2486 ml  Output             1900 ml  Net              586 ml     Exam: Heart:: Regular rate and rhythm, S1S2 present or without murmur or extra heart sounds Lungs: normal, clear to auscultation and clear to auscultation and percussion Abdomen: soft, nontender, normal bowel sounds   Lab Results: '@LABTEST2'$ @ Micro Results: No results found for this or any previous visit (from the past 240 hour(s)). Studies/Results: No results found. Medications: I have reviewed the patient's current medications. Scheduled Meds: . buprenorphine-naloxone  1 tablet Sublingual Daily  . docusate sodium  100 mg Oral BID  . nicotine  21 mg Transdermal Daily  . pantoprazole  40 mg Oral BID  . propranolol  40 mg Oral BID   Continuous Infusions: . 0.9 % NaCl with KCl 20 mEq / L 100 mL/hr at 06/21/17 0121  . cefTRIAXone (ROCEPHIN)  IV 1 g (06/21/17 0542)   PRN Meds:.bisacodyl, hydrALAZINE, LORazepam, ondansetron **OR** ondansetron (ZOFRAN) IV  Hepatic Function Latest Ref Rng & Units 06/21/2017 06/20/2017 06/19/2017  Total Protein 6.5 - 8.1 g/dL 5.5(L) 5.8(L) 6.4(L)  Albumin 3.5 - 5.0 g/dL 2.7(L) 2.8(L) 3.3(L)  AST 15 - 41 U/L 1,293(H) 2,263(H) 2,864(H)  ALT 14 - 54 U/L 1,309(H) 1,895(H) 2,211(H)  Alk  Phosphatase 38 - 126 U/L 402(H) 448(H) 524(H)  Total Bilirubin 0.3 - 1.2 mg/dL 2.1(H) 2.3(H) 2.0(H)  Bilirubin, Direct 0.1 - 0.5 mg/dL 1.3(H) - -    Assessment: Principal Problem:   Abnormal LFTs Active Problems:   Abdominal pain   Intractable nausea and vomiting   IV drug abuse Kimberlynn Lumbra Pickardis a 36 y.o.y/o femalewith acute hepatitis LFT's improving  . Hepatitis B IGM positive. No evidence of liver failure, LFT's are improving  Plan  1. F./u hepatitis serology- since improving will watch and wait. Appears she is spontaneously clearing a hepatitis B infection which appears recent. Hep BsAg and E antigen are negative  . Will need to follow up as an outpatient and repeat testing due to active drug use.  2. F/u HIV testing  3. Stop all alcohol      LOS: 3 days   Jonathon Bellows 06/21/2017, 9:19 AM

## 2017-06-21 NOTE — Consult Note (Signed)
[facility] Infectious Disease     Reason for Consult: HIV    Referring Physician: Estanislado Spire Date of Admission:  06/18/2017   Principal Problem:   Abnormal LFTs Active Problems:   Abdominal pain   Intractable nausea and vomiting   IV drug abuse   HPI: Tammy Mcdonald is a 36 y.o. female admitted with abdominal pain, nv. She has hx of IVDU and was found to have marked elevation in LFTs.   HIV test is positive. She denies any recent HIV tests. She does inject cocaine per her report but states only tried it recently. Denies sexually  active in several months Currently reports feeling  Little less abd pain and nausea but still very weak    Past Medical History:  Diagnosis Date  . Anxiety   . Hypertension    Past Surgical History:  Procedure Laterality Date  . TUBAL LIGATION  2003   Social History  Substance Use Topics  . Smoking status: Current Every Day Smoker    Packs/day: 1.00    Types: Cigarettes  . Smokeless tobacco: Never Used  . Alcohol use No   Family History  Problem Relation Age of Onset  . Hypertension Mother   . Anxiety disorder Mother   . Rheumatologic disease Mother   . Hypertension Father   . Anxiety disorder Father   . Heart attack Father   . Diabetes Sister   . Hypertension Sister   . Anxiety disorder Sister     Allergies: No Known Allergies  Current antibiotics: Antibiotics Given (last 72 hours)    Date/Time Action Medication Dose Rate   06/19/17 0558 Given   cefTRIAXone (ROCEPHIN) IVPB 1 g 1 g 100 mL/hr   06/20/17 0503 New Bag/Given   cefTRIAXone (ROCEPHIN) 1 g in dextrose 5 % 50 mL IVPB 1 g 100 mL/hr   06/21/17 0542 New Bag/Given   cefTRIAXone (ROCEPHIN) 1 g in dextrose 5 % 50 mL IVPB 1 g 100 mL/hr      MEDICATIONS: . buprenorphine-naloxone  1 tablet Sublingual Daily  . docusate sodium  100 mg Oral BID  . nicotine  21 mg Transdermal Daily  . pantoprazole  40 mg Oral BID  . propranolol  40 mg Oral BID    Review of Systems - 11  systems reviewed and negative per HPI   OBJECTIVE: Temp:  [97.7 F (36.5 C)-98.3 F (36.8 C)] 98 F (36.7 C) (09/25 0848) Pulse Rate:  [58-62] 62 (09/25 1001) Resp:  [18-20] 20 (09/25 0848) BP: (111-137)/(67-87) 117/67 (09/25 1001) SpO2:  [99 %-100 %] 99 % (09/25 0848) Weight:  [50.1 kg (110 lb 6.4 oz)] 50.1 kg (110 lb 6.4 oz) (09/25 0500) Physical Exam  Constitutional:  Thin, appears older than stated age HENT: [place]/AT, PERRLA, no scleral icterus Mouth/Throat: Oropharynx is clear and dry . No oropharyngeal exudate.  Cardiovascular: Normal rate, regular rhythm and normal heart sounds. Pulmonary/Chest: Effort normal and breath sounds normal. No respiratory distress.  has no wheezes.  Neck = supple, no nuchal rigidity Abdominal: Soft. Bowel sounds are normal.  exhibits no distension. Mild diffuse ttp   Lymphadenopathy: no cervical adenopathy. No axillary adenopathy Neurological: alert and oriented to person, place, and time.  Skin: Skin is warm and dry. No rash noted. No erythema.  Psychiatric: a normal mood and affect.  behavior is normal.    LABS: Results for orders placed or performed during the hospital encounter of 06/18/17 (from the past 48 hour(s))  Glucose, capillary  Status: None   Collection Time: 06/19/17 12:59 PM  Result Value Ref Range   Glucose-Capillary 97 65 - 99 mg/dL   Comment 1 Notify RN   Comprehensive metabolic panel     Status: Abnormal   Collection Time: 06/20/17  4:36 AM  Result Value Ref Range   Sodium 138 135 - 145 mmol/L   Potassium 4.5 3.5 - 5.1 mmol/L   Chloride 111 101 - 111 mmol/L   CO2 24 22 - 32 mmol/L   Glucose, Bld 98 65 - 99 mg/dL   BUN 7 6 - 20 mg/dL   Creatinine, Ser 0.54 0.44 - 1.00 mg/dL   Calcium 8.3 (L) 8.9 - 10.3 mg/dL   Total Protein 5.8 (L) 6.5 - 8.1 g/dL   Albumin 2.8 (L) 3.5 - 5.0 g/dL   AST 2,263 (H) 15 - 41 U/L   ALT 1,895 (H) 14 - 54 U/L   Alkaline Phosphatase 448 (H) 38 - 126 U/L   Total Bilirubin 2.3 (H) 0.3 - 1.2  mg/dL   GFR calc non Af Amer >60 >60 mL/min   GFR calc Af Amer >60 >60 mL/min    Comment: (NOTE) The eGFR has been calculated using the CKD EPI equation. This calculation has not been validated in all clinical situations. eGFR's persistently <60 mL/min signify possible Chronic Kidney Disease.    Anion gap 3 (L) 5 - 15  Protime-INR     Status: None   Collection Time: 06/20/17  4:36 AM  Result Value Ref Range   Prothrombin Time 14.8 11.4 - 15.2 seconds   INR 1.17   Glucose, capillary     Status: None   Collection Time: 06/20/17  7:39 AM  Result Value Ref Range   Glucose-Capillary 81 65 - 99 mg/dL   Comment 1 Notify RN   Hepatic function panel     Status: Abnormal   Collection Time: 06/21/17  4:35 AM  Result Value Ref Range   Total Protein 5.5 (L) 6.5 - 8.1 g/dL   Albumin 2.7 (L) 3.5 - 5.0 g/dL   AST 1,293 (H) 15 - 41 U/L   ALT 1,309 (H) 14 - 54 U/L   Alkaline Phosphatase 402 (H) 38 - 126 U/L   Total Bilirubin 2.1 (H) 0.3 - 1.2 mg/dL   Bilirubin, Direct 1.3 (H) 0.1 - 0.5 mg/dL   Indirect Bilirubin 0.8 0.3 - 0.9 mg/dL  Protime-INR     Status: None   Collection Time: 06/21/17  4:35 AM  Result Value Ref Range   Prothrombin Time 13.7 11.4 - 15.2 seconds   INR 1.06   Glucose, capillary     Status: None   Collection Time: 06/21/17  7:44 AM  Result Value Ref Range   Glucose-Capillary 88 65 - 99 mg/dL   No components found for: ESR, C REACTIVE PROTEIN MICRO: No results found for this or any previous visit (from the past 720 hour(s)).  IMAGING: US Abdominal Pelvic Art/vent Flow Doppler  Result Date: 06/19/2017 CLINICAL DATA:  Hepatic failure with markedly elevated liver function tests. EXAM: DUPLEX ULTRASOUND OF LIVER TECHNIQUE: Color and duplex Doppler ultrasound was performed to evaluate the hepatic in-flow and out-flow vessels. COMPARISON:  Abdominal ultrasound on 06/18/2017 FINDINGS: Portal Vein Velocities Main:  39 cm/sec Right:  20 cm/sec Left:  13 cm/sec Hepatic Vein  Velocities Right:  18 cm/sec Middle:  23 cm/sec Left:  18 cm/sec Hepatic Artery Velocity:  63 cm/sec Splenic Vein Velocity:  9 cm/sec Varices: None visualized. Ascites: None  visualized. No evidence of portal vein thrombus. The spleen is not enlarged. No abnormal waveforms. IMPRESSION: Unremarkable hepatic duplex ultrasound demonstrating no evidence of portal vein thrombosis, hepatic veno-occlusive disease or portal hypertension. Electronically Signed   By: Aletta Edouard M.D.   On: 06/19/2017 13:50   US Abdomen Limited Ruq  Result Date: 06/18/2017 CLINICAL DATA:  Liver failure. EXAM: ULTRASOUND ABDOMEN LIMITED RIGHT UPPER QUADRANT COMPARISON:  None. FINDINGS: Gallbladder: No stones, sludge, wall thickening, or Murphy's sign. A tiny amount of pericholecystic fluid is identified. Common bile duct: Diameter: 2.9 mm Liver: Increased hepatic echogenicity with no focal mass. Portal vein is patent on color Doppler imaging with normal direction of blood flow towards the liver. IMPRESSION: 1. There is a tiny amount of pericholecystic fluid. The gallbladder is otherwise normal. The fluid is nonspecific and could be secondary to nearly any cause of liver failure. If there is concern for cholecystitis, a HIDA scan could further evaluate. 2. Increased echogenicity in the liver is a nonspecific finding. Hepatic steatosis is possible. No mass identified. Electronically Signed   By: Dorise Bullion III M.D   On: 06/18/2017 11:34    Assessment:   Tammy Mcdonald is a 36 y.o. female with hx IVDU admitted with acute hepatitis likely viral in origin and found to have HIV test +.  Cd4 count is quite good but VL is pending. Other testing pending.  Discussed the dx with her and her mother. WIll plan to await test results and start ART as otpt.   Recommendations Check VL and Genotype Check RPR Pending wu of liver disease.  Thank you very much for allowing me to participate in the care of this patient. Please call with  questions.   Cheral Marker. Ola Spurr, MD

## 2017-06-22 LAB — HEPATIC FUNCTION PANEL
ALBUMIN: 2.9 g/dL — AB (ref 3.5–5.0)
ALT: 919 U/L — ABNORMAL HIGH (ref 14–54)
AST: 569 U/L — ABNORMAL HIGH (ref 15–41)
Alkaline Phosphatase: 375 U/L — ABNORMAL HIGH (ref 38–126)
BILIRUBIN DIRECT: 0.6 mg/dL — AB (ref 0.1–0.5)
BILIRUBIN INDIRECT: 0.6 mg/dL (ref 0.3–0.9)
BILIRUBIN TOTAL: 1.2 mg/dL (ref 0.3–1.2)
Total Protein: 5.8 g/dL — ABNORMAL LOW (ref 6.5–8.1)

## 2017-06-22 LAB — GLUCOSE, CAPILLARY: GLUCOSE-CAPILLARY: 82 mg/dL (ref 65–99)

## 2017-06-22 LAB — HEPATITIS C VRS RNA DETECT BY PCR-QUAL: Hepatitis C Vrs RNA by PCR-Qual: POSITIVE — AB

## 2017-06-22 NOTE — Discharge Instructions (Signed)
Smoking and Drug abuse cessation. Low fat diet.

## 2017-06-22 NOTE — Care Management (Signed)
Patient to discharge home today.  No prescriptions a discharge.  Patient provided with application to [facility] and Medication Management.  RNCM signing off.

## 2017-06-22 NOTE — Progress Notes (Signed)
Tammy Mcdonald to be D/C'd home per MD order.  Discussed prescriptions and follow up appointments with the patient. Prescriptions given to patient, medication list explained in detail. Pt verbalized understanding.  Allergies as of 06/22/2017   No Known Allergies     Medication List    TAKE these medications   buprenorphine-naloxone 8-2 mg Subl SL tablet Commonly known as:  SUBOXONE Place 1 tablet under the tongue daily.   propranolol 40 MG tablet Commonly known as:  INDERAL Take 1 tablet (40 mg total) by mouth 2 (two) times daily.            Discharge Care Instructions        Start     Ordered   06/22/17 0000  Increase activity slowly     06/22/17 1012   06/22/17 0000  Diet - low sodium heart healthy     06/22/17 1012      Vitals:   06/21/17 1956 06/22/17 0419  BP: (!) 178/106 (!) 144/79  Pulse: 60 64  Resp: 20 20  Temp: 98.4 F (36.9 C) 98.3 F (36.8 C)  SpO2: 100% 98%    Skin clean, dry and intact without evidence of skin break down, no evidence of skin tears noted. IV catheter discontinued intact. Site without signs and symptoms of complications. Dressing and pressure applied. Pt denies pain at this time. No complaints noted.  An After Visit Summary was printed and given to the patient. Patient escorted via WC, and D/C home via private auto.  Tammy Mcdonald A

## 2017-06-22 NOTE — Discharge Summary (Signed)
[facility] [place] [facility]   PATIENT NAME: Tammy Mcdonald    MR#:  [id number]  DATE OF BIRTH:  December 26, 1980  DATE OF ADMISSION:  06/18/2017   ADMITTING PHYSICIAN: Marguarite Arbour, MD  DATE OF DISCHARGE: 06/22/2017 PRIMARY CARE PHYSICIAN: Patient, No Pcp Per   ADMISSION DIAGNOSIS:  Liver failure (HCC) [K72.90] Polysubstance abuse [F19.10] Acute cystitis with hematuria [N30.01] Abdominal pain [R10.9] DISCHARGE DIAGNOSIS:  Principal Problem:   Abnormal LFTs Active Problems:   Abdominal pain   Intractable nausea and vomiting   IV drug abuse  SECONDARY DIAGNOSIS:   Past Medical History:  Diagnosis Date  . Anxiety   . Hypertension    HOSPITAL COURSE:  36 year old female with polysubstance abuse who presented with abdominal pain, nausea and vomiting and found to have elevated liver function tests.  1. Elevated liver function tests, likely due to acute viral hepatitis (Hepatitis B) Improving. Follow-up LFT as outpatient.  INR is normal. Right upper quadrant ultrasound shows no acute pathology Unremarkable Doppler ultrasound  Follow-up HSV, CMV, varicella-zoster, EBV with Dr. Tobi Bastos and Dr. Sampson Goon. Tylenol level within normal limits  2. HIV positive: This is a new diagnosis Per Dr. Sampson Goon, await test results and start ART as otpt.   3. IV drug use: Patient was counseled Continue Suboxone  4. Tobacco dependence: Patient is encouraged to quit smoking. Counseling was provided for 4 minutes. Continue nicotine patch  5. UTI:  She has been treated with iv Rocephin for 5 days, follow-up urine culture result as outpatient.  6. Essential hypertension on propanolol  DISCHARGE CONDITIONS:  Stable, discharge to home today. CONSULTS OBTAINED:  Treatment Team:  Wyline Mood, MD Mick Sell, MD DRUG ALLERGIES:  No Known Allergies DISCHARGE MEDICATIONS:   Allergies as of 06/22/2017   No Known Allergies     Medication List    TAKE these medications   buprenorphine-naloxone 8-2 mg Subl SL tablet Commonly known as:  SUBOXONE Place 1 tablet under the tongue daily.   propranolol 40 MG tablet Commonly known as:  INDERAL Take 1 tablet (40 mg total) by mouth 2 (two) times daily.            Discharge Care Instructions        Start     Ordered   06/22/17 0000  Increase activity slowly     06/22/17 1012   06/22/17 0000  Diet - low sodium heart healthy     06/22/17 1012       DISCHARGE INSTRUCTIONS:  See AVS.  If you experience worsening of your admission symptoms, develop shortness of breath, life threatening emergency, suicidal or homicidal thoughts you must seek medical attention immediately by calling 911 or calling your MD immediately  if symptoms less severe.  You Must read complete instructions/literature along with all the possible adverse reactions/side effects for all the Medicines you take and that have been prescribed to you. Take any new Medicines after you have completely understood and accpet all the possible adverse reactions/side effects.   Please note  You were cared for by a hospitalist during your hospital stay. If you have any questions about your discharge medications or the care you received while you were in the hospital after you are discharged, you can call the unit and asked to speak with the hospitalist on call if the hospitalist that took care of you is not available. Once you are discharged, your primary care physician will handle any further medical issues. Please note  that NO REFILLS for any discharge medications will be authorized once you are discharged, as it is imperative that you return to your primary care physician (or establish a relationship with a primary care physician if you do not have one) for your aftercare needs so that they can reassess your need for medications and monitor your lab values.    On the day of Discharge:  VITAL SIGNS:  Blood pressure (!)  144/79, pulse 64, temperature 98.3 F (36.8 C), temperature source Oral, resp. rate 20, height  (1.626 m), weight 110 lb 6.4 oz (50.1 kg), last menstrual period 06/11/2017, SpO2 98 %. PHYSICAL EXAMINATION:  GENERAL:  36 y.o.-year-old patient lying in the bed with no acute distress.  EYES: Pupils equal, round, reactive to light and accommodation. No scleral icterus. Extraocular muscles intact.  HEENT: Head atraumatic, normocephalic. Oropharynx and nasopharynx clear.  NECK:  Supple, no jugular venous distention. No thyroid enlargement, no tenderness.  LUNGS: Normal breath sounds bilaterally, no wheezing, rales,rhonchi or crepitation. No use of accessory muscles of respiration.  CARDIOVASCULAR: S1, S2 normal. No murmurs, rubs, or gallops.  ABDOMEN: Soft, non-tender, non-distended. Bowel sounds present. No organomegaly or mass.  EXTREMITIES: No pedal edema, cyanosis, or clubbing.  NEUROLOGIC: Cranial nerves II through XII are intact. Muscle strength 5/5 in all extremities. Sensation intact. Gait not checked.  PSYCHIATRIC: The patient is alert and oriented x 3.  SKIN: No obvious rash, lesion, or ulcer.  DATA REVIEW:   CBC  Recent Labs Lab 06/19/17 1133  WBC 9.5  HGB 15.5  HCT 47.0*  PLT 204    Chemistries   Recent Labs Lab 06/20/17 0436  06/22/17 0500  NA 138  --   --   K 4.5  --   --   CL 111  --   --   CO2 24  --   --   GLUCOSE 98  --   --   BUN 7  --   --   CREATININE 0.54  --   --   CALCIUM 8.3*  --   --   AST 2,263*  < > 569*  ALT 1,895*  < > 919*  ALKPHOS 448*  < > 375*  BILITOT 2.3*  < > 1.2  < > = values in this interval not displayed.   Microbiology Results  No results found for this or any previous visit.  RADIOLOGY:  No results found.   Management plans discussed with the patient, family and they are in agreement.  CODE STATUS: Full Code   TOTAL TIME TAKING CARE OF THIS PATIENT: 32 minutes.    Shaune Pollack M.D on 06/22/2017 at 1:16 PM  Between  7am to 6pm - Pager - [phone]  After 6pm go to [email or web address] - Social research officer, government  [facility] [place] [facility]  Office  [phone]  CC: Primary care physician; Patient, No Pcp Per   Note: This dictation was prepared with Dragon dictation along with smaller phrase technology. Any transcriptional errors that result from this process are unintentional.

## 2017-06-23 LAB — REFLEX TO GENOSURE(R) MG
HIV GENOSURE(R): UNDETERMINED
HIV GENOSURE: UNDETERMINED
HIV GenoSure(R) MG PDF: UNDETERMINED

## 2017-06-23 LAB — VARICELLA-ZOSTER BY PCR: VARICELLA-ZOSTER, PCR: NEGATIVE

## 2017-06-23 LAB — RPR: RPR: NONREACTIVE

## 2017-06-28 ENCOUNTER — Ambulatory Visit: Payer: Medicaid Other | Admitting: Gastroenterology

## 2017-06-28 ENCOUNTER — Telehealth: Payer: Self-pay

## 2017-06-28 DIAGNOSIS — F419 Anxiety disorder, unspecified: Secondary | ICD-10-CM | POA: Insufficient documentation

## 2017-06-28 NOTE — Telephone Encounter (Signed)
LVM for patient callback for results per Dr. Tobi Bastos.   Hepatitis C positive- suggest she follow up with ID as she also has possible HIV which was discussed at during her hospitalization.

## 2017-06-28 NOTE — Telephone Encounter (Signed)
-----   Message from Wyline Mood, MD sent at 06/26/2017  1:19 PM EDT ----- Hepatitis C positive- suggest she follow up with ID as she also has possible HIV which was discussed at during her hospitalization

## 2017-08-04 LAB — EBV AB TO VIRAL CAPSID AG PNL, IGG+IGM: EBV VCA IgG: 216 U/mL — ABNORMAL HIGH (ref 0.0–17.9)

## 2017-08-05 LAB — CMV DNA, QUANTITATIVE, PCR
CMV DNA Quant: NEGATIVE IU/mL
Log10 CMV Qn DNA Pl: UNDETERMINED log10 IU/mL

## 2018-04-06 IMAGING — US US ART/VEN ABD/PELV/SCROTUM DOPPLER COMPLETE
1 series · 14 of 25 positions shown · non-contrast
Comparison: Abdominal ultrasound on 06/18/2017

CLINICAL DATA: Hepatic failure with markedly elevated liver
function tests.

EXAM:
DUPLEX ULTRASOUND OF LIVER
TECHNIQUE: Color and duplex Doppler ultrasound was performed to evaluate the
hepatic in-flow and out-flow vessels.

[Series 1: us art/ven abd/pelv/scrotum doppler complete · 0.23mm/px · 14 of 44 slices shown]
[im 1/44]
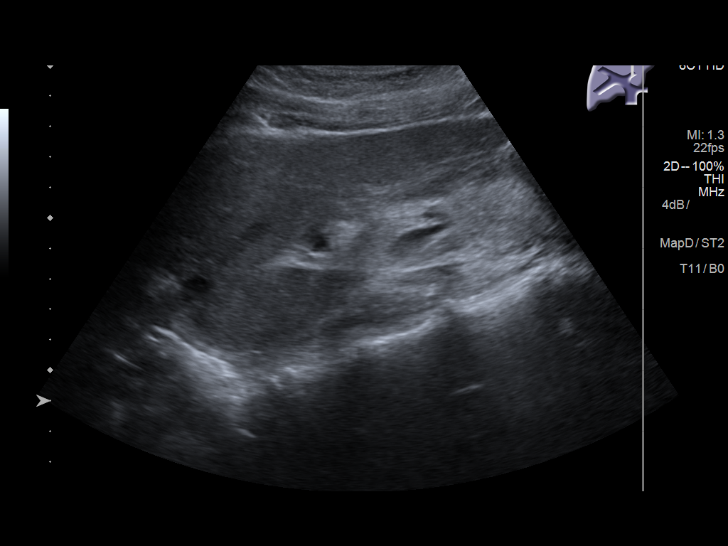
[im 4/44]
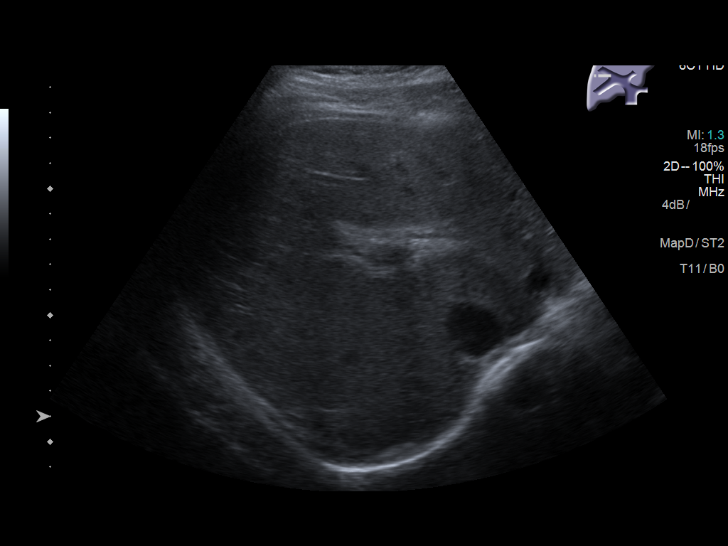
[im 8/44]
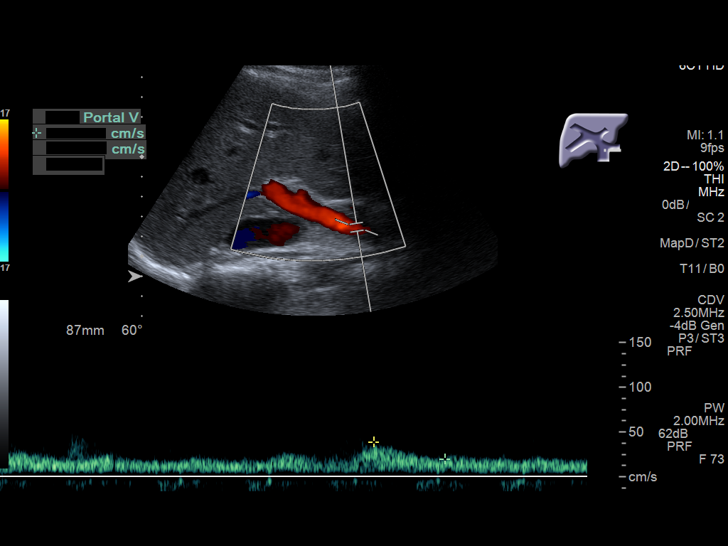
[im 11/44]
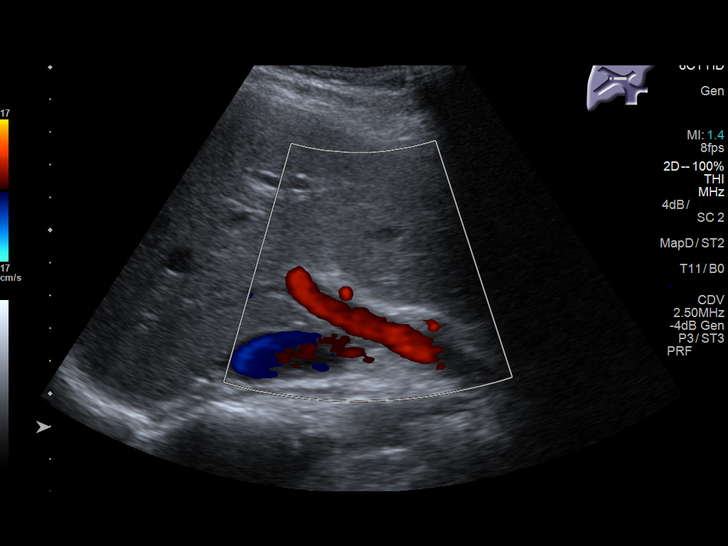
[im 15/44]
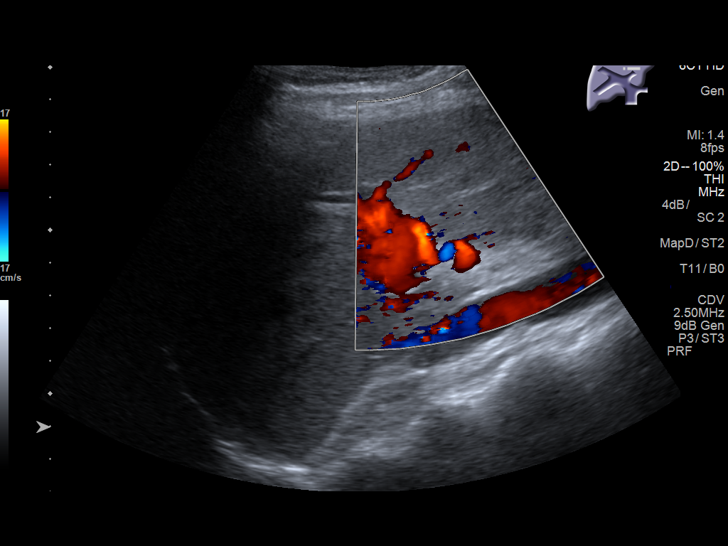
[im 17/44]
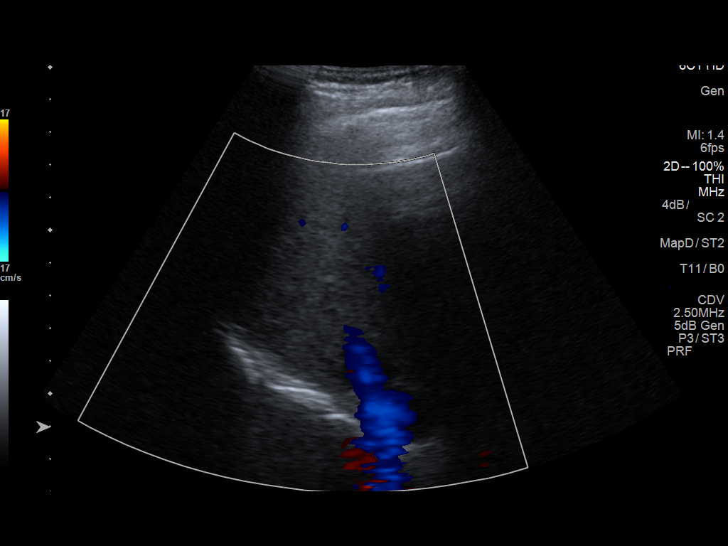
[im 20/44]
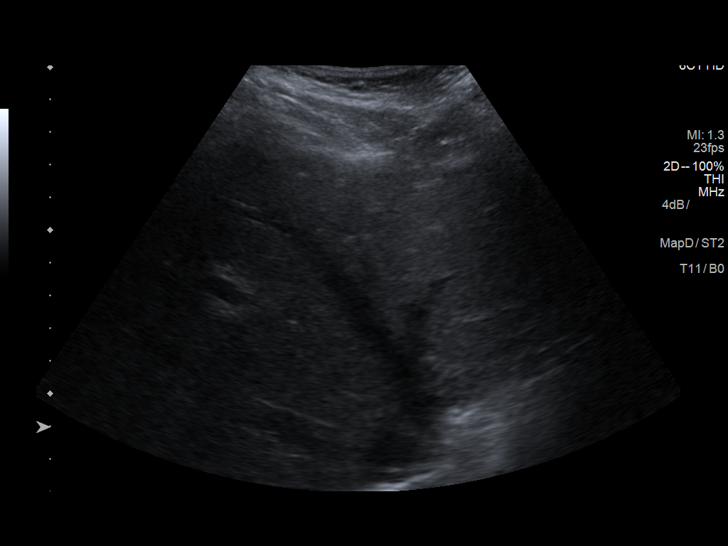
[im 24/44]
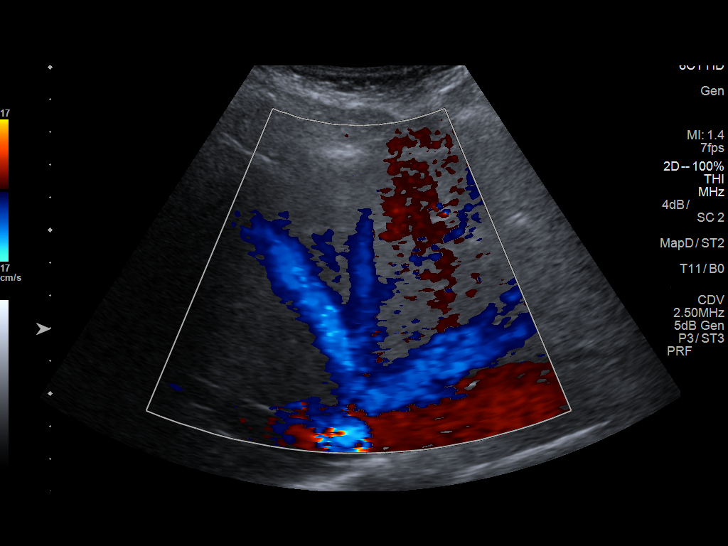
[im 27/44]
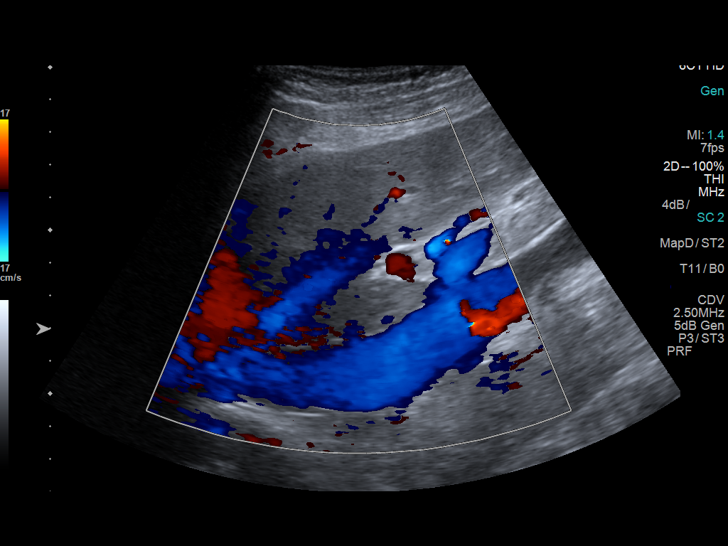
[im 29/44]
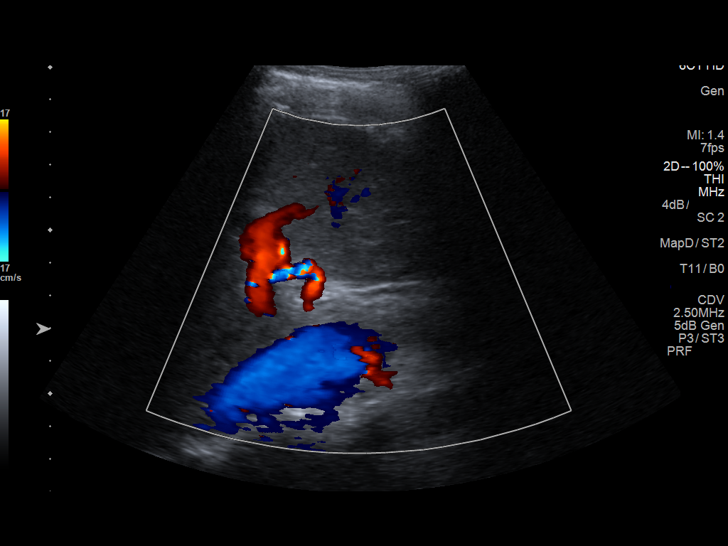
[im 33/44]
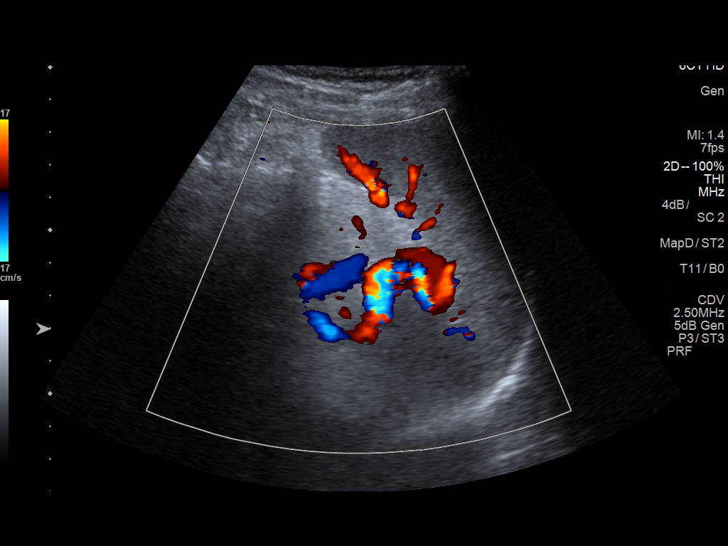
[im 36/44]
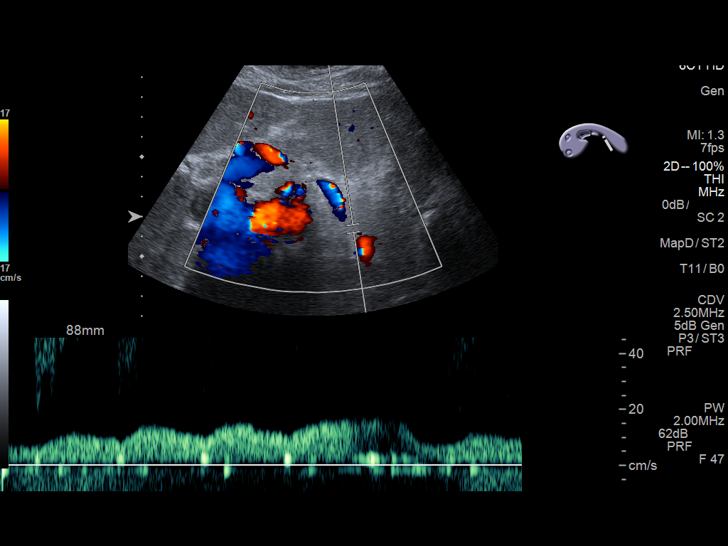
[im 40/44]
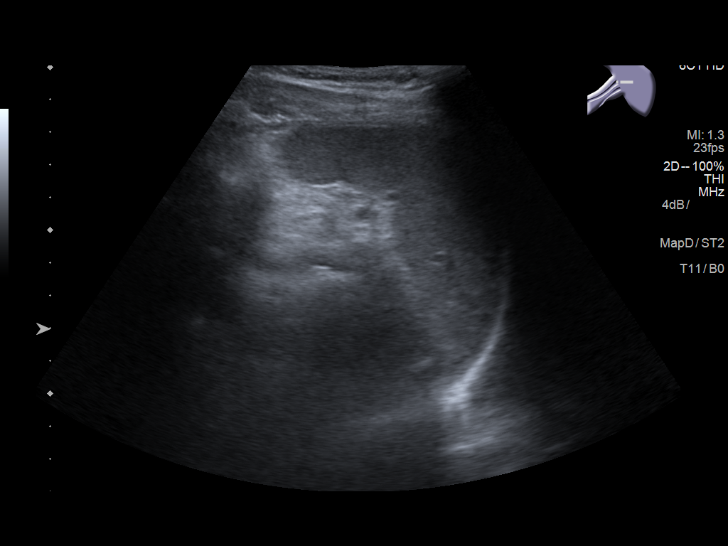
[im 44/44]
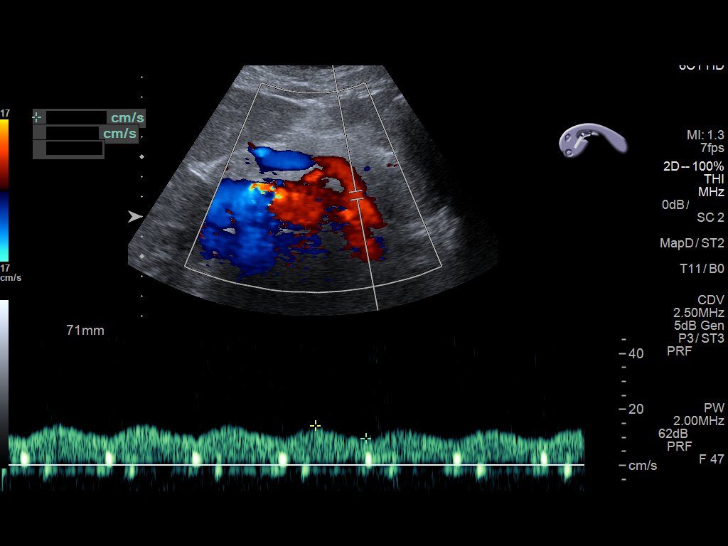

[14 of 25 positions shown; findings below may reference images not displayed]

FINDINGS: Portal Vein Velocities

Main:  39 cm/sec

Right:  20 cm/sec

Left:  13 cm/sec

Hepatic Vein Velocities

Right:  18 cm/sec

Middle:  23 cm/sec

Left:  18 cm/sec

Hepatic Artery Velocity:  63 cm/sec

Splenic Vein Velocity:  9 cm/sec

Varices: None visualized.

Ascites: None visualized.

No evidence of portal vein thrombus. The spleen is not enlarged. No
abnormal waveforms.
IMPRESSION: Unremarkable hepatic duplex ultrasound demonstrating no evidence of
portal vein thrombosis, hepatic [place] disease or portal
hypertension.

## 2018-07-12 ENCOUNTER — Encounter: Payer: Self-pay | Admitting: Emergency Medicine

## 2018-07-12 ENCOUNTER — Other Ambulatory Visit: Payer: Self-pay

## 2018-07-12 ENCOUNTER — Emergency Department
Admission: EM | Admit: 2018-07-12 | Discharge: 2018-07-12 | Disposition: A | Discharge status: Home / Self Care | Payer: Medicaid Other | Attending: Emergency Medicine | Admitting: Emergency Medicine

## 2018-07-12 DIAGNOSIS — Y33XXXA Other specified events, undetermined intent, initial encounter: Secondary | ICD-10-CM | POA: Insufficient documentation

## 2018-07-12 DIAGNOSIS — I10 Essential (primary) hypertension: Secondary | ICD-10-CM | POA: Insufficient documentation

## 2018-07-12 DIAGNOSIS — T1581XA Foreign body in other and multiple parts of external eye, right eye, initial encounter: Secondary | ICD-10-CM | POA: Insufficient documentation

## 2018-07-12 DIAGNOSIS — Y92018 Other place in single-family (private) house as the place of occurrence of the external cause: Secondary | ICD-10-CM | POA: Insufficient documentation

## 2018-07-12 DIAGNOSIS — Y999 Unspecified external cause status: Secondary | ICD-10-CM | POA: Insufficient documentation

## 2018-07-12 DIAGNOSIS — Y9389 Activity, other specified: Secondary | ICD-10-CM | POA: Insufficient documentation

## 2018-07-12 DIAGNOSIS — Z79899 Other long term (current) drug therapy: Secondary | ICD-10-CM | POA: Insufficient documentation

## 2018-07-12 DIAGNOSIS — F17210 Nicotine dependence, cigarettes, uncomplicated: Secondary | ICD-10-CM | POA: Insufficient documentation

## 2018-07-12 DIAGNOSIS — T1591XA Foreign body on external eye, part unspecified, right eye, initial encounter: Secondary | ICD-10-CM

## 2018-07-12 MED ORDER — ERYTHROMYCIN 5 MG/GM OP OINT
TOPICAL_OINTMENT | Freq: Once | OPHTHALMIC | Status: DC
  Filled 2018-07-12: qty 2

## 2018-07-12 NOTE — ED Triage Notes (Signed)
Patient states she put fingernail glue in her right eye approx 30 min ago thinking it  Was her eye drops.  Lashes are glued shut, unable to open eye.  States she tried to wash it out, states eye burns at this time.

## 2018-07-12 NOTE — Discharge Instructions (Addendum)
Please go to [facility] today at 1:00pm. Dr. Inez Pilgrim is expecting you.

## 2018-07-12 NOTE — ED Provider Notes (Signed)
[facility] Emergency Department Provider Note  ____________________________________________  Time seen: Approximately 10:47 AM  I have reviewed the triage vital signs and the nursing notes.   HISTORY  Chief Complaint Eye Problem    HPI Tammy Mcdonald is a 37 y.o. female that presents to the emergency department for evaluation of fingernail glue to right eye for 30 minutes.  Patient thought that the glue was her eyedrops.  Past Medical History:  Diagnosis Date  . Anxiety   . Hypertension     Patient Active Problem List   Diagnosis Date Noted  . Anxiety 06/28/2017  . Abdominal pain 06/18/2017  . Abnormal LFTs 06/18/2017  . Intractable nausea and vomiting 06/18/2017  . IV drug abuse (HCC) 06/18/2017    Past Surgical History:  Procedure Laterality Date  . TUBAL LIGATION  2003    Prior to Admission medications   Medication Sig Start Date End Date Taking? Authorizing Provider  buprenorphine-naloxone (SUBOXONE) 8-2 mg SUBL SL tablet Place 1 tablet under the tongue daily.    [provider]  propranolol (INDERAL) 40 MG tablet Take 1 tablet (40 mg total) by mouth 2 (two) times daily. Patient not taking: Reported on 06/18/2017 08/03/16   Rockne Menghini, MD    Allergies Other  Family History  Problem Relation Age of Onset  . Hypertension Mother   . Anxiety disorder Mother   . Rheumatologic disease Mother   . Hypertension Father   . Anxiety disorder Father   . Heart attack Father   . Diabetes Sister   . Hypertension Sister   . Anxiety disorder Sister     Social History Social History   Tobacco Use  . Smoking status: Current Every Day Smoker    Packs/day: 1.00    Types: Cigarettes  . Smokeless tobacco: Never Used  Substance Use Topics  . Alcohol use: No  . Drug use: No     Review of Systems  Gastrointestinal: No nausea, no vomiting.  Musculoskeletal: Negative for musculoskeletal pain. Skin: Negative for rash,  abrasions, lacerations, ecchymosis. Neurological: Negative for headaches   ____________________________________________   PHYSICAL EXAM:  VITAL SIGNS: ED Triage Vitals  Enc Vitals Group     BP 07/12/18 1028 (!) 151/98     Pulse Rate 07/12/18 1028 80     Resp 07/12/18 1028 20     Temp 07/12/18 1028 (!) 97.3 F (36.3 C)     Temp Source 07/12/18 1028 Oral     SpO2 07/12/18 1028 100 %     Weight 07/12/18 1029 110 lb (49.9 kg)     Height 07/12/18 1029 5\' 4"  (1.626 m)     Head Circumference --      Peak Flow --      Pain Score 07/12/18 1029 9     Pain Loc --      Pain Edu? --      Excl. in GC? --      Constitutional: Alert and oriented. Well appearing and in no acute distress. Eyes: Right eye glued shut. Glue caked in eyelashes and between eyelids.  Head: Atraumatic. ENT:      Ears:      Nose: No congestion/rhinnorhea.      Mouth/Throat: Mucous membranes are moist.  Neck: No stridor.  Cardiovascular: Normal rate, regular rhythm.  Good peripheral circulation. Respiratory: Normal respiratory effort without tachypnea or retractions. Lungs CTAB. Good air entry to the bases with no decreased or absent breath sounds. Musculoskeletal: Full range of motion  to all extremities. No gross deformities appreciated. Neurologic:  Normal speech and language. No gross focal neurologic deficits are appreciated.  Skin:  Skin is warm, dry and intact. No rash noted. Psychiatric: Mood and affect are normal. Speech and behavior are normal. Patient exhibits appropriate insight and judgement.   ____________________________________________   LABS (all labs ordered are listed, but only abnormal results are displayed)  Labs Reviewed - No data to display ____________________________________________  EKG   ____________________________________________  RADIOLOGY  No results found.  ____________________________________________    PROCEDURES  Procedure(s) performed:     Procedures    Medications  erythromycin ophthalmic ointment (has no administration in time range)     ____________________________________________   INITIAL IMPRESSION / ASSESSMENT AND PLAN / ED COURSE  Pertinent labs & imaging results that were available during my care of the patient were reviewed by me and considered in my medical decision making (see chart for details).  Review of the [place] CSRS was performed in accordance of the [facility] prior to dispensing any controlled drugs.  ----------------------------------------- 10:45 AM on 07/12/2018 -----------------------------------------  Petroleum gauze and erythromycin ointment applied to eyelashes.  ----------------------------------------- 11:15 AM on 07/12/2018 -----------------------------------------  Dr. Inez Pilgrim with opthamology paged in in OR  ----------------------------------------- 11:45 AM on 07/12/2018 -----------------------------------------  Dr. Inez Pilgrim returned page and recommends that patient go to [facility] for further evaluation and treatment.    Patient presents emergency department for evaluation of glue to right eye.  She has been in the emergency department attempting removal with petroleum and erythromycin ointment.  She is still unable to open her eye.  She is agreeable to go to [facility] at 1 PM today for an appointment with Dr. Sharman Crate.      ____________________________________________  FINAL CLINICAL IMPRESSION(S) / ED DIAGNOSES  Final diagnoses:  Foreign body of right eye, initial encounter      NEW MEDICATIONS STARTED DURING THIS VISIT:  ED Discharge Orders    None          This chart was dictated using voice recognition software/Dragon. Despite best efforts to proofread, errors can occur which can change the meaning. Any change was purely unintentional.    Enid Derry, PA-C 07/12/18 1400    Emily Filbert, MD 07/12/18 [phone]

## 2018-07-12 NOTE — ED Notes (Signed)
Patient placed in Rm 40, report given to Bridget Hartshorn PA.

## 2018-09-09 ENCOUNTER — Emergency Department: Payer: Self-pay

## 2018-09-09 ENCOUNTER — Encounter: Payer: Self-pay | Admitting: Emergency Medicine

## 2018-09-09 ENCOUNTER — Other Ambulatory Visit: Payer: Self-pay

## 2018-09-09 ENCOUNTER — Emergency Department
Admission: EM | Admit: 2018-09-09 | Discharge: 2018-09-09 | Disposition: A | Discharge status: Home / Self Care | Payer: Self-pay | Attending: Emergency Medicine | Admitting: Emergency Medicine

## 2018-09-09 DIAGNOSIS — Z79899 Other long term (current) drug therapy: Secondary | ICD-10-CM | POA: Insufficient documentation

## 2018-09-09 DIAGNOSIS — L0231 Cutaneous abscess of buttock: Secondary | ICD-10-CM | POA: Insufficient documentation

## 2018-09-09 DIAGNOSIS — F17210 Nicotine dependence, cigarettes, uncomplicated: Secondary | ICD-10-CM | POA: Insufficient documentation

## 2018-09-09 DIAGNOSIS — J069 Acute upper respiratory infection, unspecified: Secondary | ICD-10-CM | POA: Insufficient documentation

## 2018-09-09 DIAGNOSIS — I10 Essential (primary) hypertension: Secondary | ICD-10-CM | POA: Insufficient documentation

## 2018-09-09 DIAGNOSIS — B9789 Other viral agents as the cause of diseases classified elsewhere: Secondary | ICD-10-CM | POA: Insufficient documentation

## 2018-09-09 DIAGNOSIS — L0291 Cutaneous abscess, unspecified: Secondary | ICD-10-CM

## 2018-09-09 MED ORDER — BENZONATATE 100 MG PO CAPS: 100.0000 mg | ORAL_CAPSULE | Freq: Three times a day (TID) | ORAL | 0 refills | Status: AC | PRN

## 2018-09-09 MED ORDER — SULFAMETHOXAZOLE-TRIMETHOPRIM 800-160 MG PO TABS: 1.0000 | ORAL_TABLET | Freq: Two times a day (BID) | ORAL | 0 refills | Status: AC

## 2018-09-09 MED ORDER — BENZONATATE 100 MG PO CAPS: 100.0000 mg | ORAL_CAPSULE | Freq: Three times a day (TID) | ORAL | 0 refills | Status: DC | PRN

## 2018-09-09 MED ORDER — LIDOCAINE HCL (PF) 1 % IJ SOLN
INTRAMUSCULAR | Status: AC
  Filled 2018-09-09: qty 5

## 2018-09-09 NOTE — ED Triage Notes (Signed)
States has had cold symptoms x 1 week, also possible abscess by "tailbone" x 1 week.

## 2018-09-09 NOTE — ED Provider Notes (Signed)
[facility] Emergency Department Provider Note  ____________________________________________  Time seen: Approximately 5:28 PM  I have reviewed the triage vital signs and the nursing notes.   HISTORY  Chief Complaint Nasal Congestion and Abscess    HPI Tammy Mcdonald is a 37 y.o. female presents to the emergency department with 2 complaints.  Patient has had rhinorrhea, congestion and nonproductive cough for approximately 1 week.  She recently recovered from a viral URI and is concerned that her symptoms seem to be worsening again. She reports subjective tactile fever at home.  She denies shortness of breath or purulent sputum production but has felt fatigued lately.  Patient secondarily complains of a 2 cm x 2 cm right buttocks abscess that has been apparent for the past week.  Patient has a history of MRSA and has had an abscess in a similar location.  Patient has declined incision and drainage in the emergency department today and is here just for "antibiotics".  Patient has an appointment with her PCP on Monday.    Past Medical History:  Diagnosis Date  . Anxiety   . Hypertension     Patient Active Problem List   Diagnosis Date Noted  . Anxiety 06/28/2017  . Abdominal pain 06/18/2017  . Abnormal LFTs 06/18/2017  . Intractable nausea and vomiting 06/18/2017  . IV drug abuse (HCC) 06/18/2017    Past Surgical History:  Procedure Laterality Date  . TUBAL LIGATION  2003    Prior to Admission medications   Medication Sig Start Date End Date Taking? Authorizing Provider  buprenorphine-naloxone (SUBOXONE) 8-2 mg SUBL SL tablet Place 1 tablet under the tongue daily.    [provider]  propranolol (INDERAL) 40 MG tablet Take 1 tablet (40 mg total) by mouth 2 (two) times daily. Patient not taking: Reported on 06/18/2017 08/03/16   Rockne Menghini, MD  sulfamethoxazole-trimethoprim (BACTRIM DS,SEPTRA DS) 800-160 MG tablet Take 1 tablet by  mouth 2 (two) times daily for 7 days. 09/09/18 09/16/18  Orvil Feil, PA-C    Allergies Other  Family History  Problem Relation Age of Onset  . Hypertension Mother   . Anxiety disorder Mother   . Rheumatologic disease Mother   . Hypertension Father   . Anxiety disorder Father   . Heart attack Father   . Diabetes Sister   . Hypertension Sister   . Anxiety disorder Sister     Social History Social History   Tobacco Use  . Smoking status: Current Every Day Smoker    Packs/day: 1.00    Types: Cigarettes  . Smokeless tobacco: Never Used  Substance Use Topics  . Alcohol use: No  . Drug use: No     Review of Systems  Constitutional: Patient has had low grade fever.  Eyes: No visual changes. No discharge ENT: Patient has nasal congestion and cough.  Cardiovascular: no chest pain. Respiratory: no cough. No SOB. Gastrointestinal: No abdominal pain.  No nausea, no vomiting.  No diarrhea.  No constipation. Genitourinary: Negative for dysuria. No hematuria Musculoskeletal: Negative for musculoskeletal pain. Skin: Patient has abscess.  Neurological: Negative for headaches, focal weakness or numbness.   ____________________________________________   PHYSICAL EXAM:  VITAL SIGNS: ED Triage Vitals  Enc Vitals Group     BP 09/09/18 1530 (!) 149/85     Pulse Rate 09/09/18 1530 72     Resp 09/09/18 1530 18     Temp 09/09/18 1530 98.5 F (36.9 C)     Temp Source 09/09/18  1530 Oral     SpO2 09/09/18 1530 99 %     Weight 09/09/18 1531 110 lb (49.9 kg)     Height 09/09/18 1531 5\' 4"  (1.626 m)     Head Circumference --      Peak Flow --      Pain Score 09/09/18 1531 10     Pain Loc --      Pain Edu? --      Excl. in GC? --      Constitutional: Alert and oriented. Well appearing and in no acute distress. Eyes: Conjunctivae are normal. PERRL. EOMI. Head: Atraumatic. ENT:      Ears: TMs are pearly.       Nose: No congestion/rhinnorhea.      Mouth/Throat: Mucous  membranes are moist.  Neck: No stridor.  No cervical spine tenderness to palpation. Hematological/Lymphatic/Immunilogical: No cervical lymphadenopathy. Cardiovascular: Normal rate, regular rhythm. Normal S1 and S2.  Good peripheral circulation. Respiratory: Normal respiratory effort without tachypnea or retractions. Lungs CTAB. Good air entry to the bases with no decreased or absent breath sounds. Gastrointestinal: Bowel sounds 4 quadrants. Soft and nontender to palpation. No guarding or rigidity. No palpable masses. No distention. No CVA tenderness. Musculoskeletal: Full range of motion to all extremities. No gross deformities appreciated. Neurologic:  Normal speech and language. No gross focal neurologic deficits are appreciated.  Skin: Patient has a 2 cm x 2 cm right gluteal abscess with palpable induration and fluctuance.  She has approximately 1 cm of surrounding cellulitis. Psychiatric: Mood and affect are normal. Speech and behavior are normal. Patient exhibits appropriate insight and judgement.   ____________________________________________   LABS (all labs ordered are listed, but only abnormal results are displayed)  Labs Reviewed - No data to display ____________________________________________  EKG   ____________________________________________  RADIOLOGY I personally viewed and evaluated these images as part of my medical decision making, as well as reviewing the written report by the radiologist.    Dg Chest 2 View  Result Date: 09/09/2018 CLINICAL DATA:  Cough and congestion. EXAM: CHEST - 2 VIEW COMPARISON:  August 03, 2016 FINDINGS: Are, IMPRESSION: No active cardiopulmonary disease. Electronically Signed   By: Gerome Sam III M.D   On: 09/09/2018 17:50    ____________________________________________    PROCEDURES  Procedure(s) performed:    Procedures    Medications - No data to  display   ____________________________________________   INITIAL IMPRESSION / ASSESSMENT AND PLAN / ED COURSE  Pertinent labs & imaging results that were available during my care of the patient were reviewed by me and considered in my medical decision making (see chart for details).  Review of the [place] CSRS was performed in accordance of the [facility] prior to dispensing any controlled drugs.    Assessment and Plan: Viral URI Abscess   Patient presents to the emergency department with nonproductive cough, rhinorrhea and congestion for approximately 1 week and an abscess along the right buttocks.  Chest x-ray revealed no consolidations, opacities or infiltrates that would suggest community-acquired pneumonia.  Patient was on prednisone 2 weeks ago.  I do not want to restart patient on steroids given recent course of prednisone.  Patient was discharged with [facility].  Patient declined incision and drainage in the emergency department today stating that she "wants her boyfriend do it".  Patient was discharged with Bactrim.  Strict return precautions were given to return to the emergency department for new or worsening symptoms.  All patient questions were answered.  ____________________________________________  FINAL CLINICAL IMPRESSION(S) / ED DIAGNOSES  Final diagnoses:  Viral URI with cough  Abscess      NEW MEDICATIONS STARTED DURING THIS VISIT:  ED Discharge Orders         Ordered    sulfamethoxazole-trimethoprim (BACTRIM DS,SEPTRA DS) 800-160 MG tablet  2 times daily     09/09/18 1815              This chart was dictated using voice recognition software/Dragon. Despite best efforts to proofread, errors can occur which can change the meaning. Any change was purely unintentional.    Orvil FeilWoods, Jaclyn M, PA-C 09/09/18 1847    Myrna BlazerSchaevitz, David Matthew, MD 09/10/18 [phone]

## 2020-10-14 ENCOUNTER — Emergency Department
Admission: EM | Admit: 2020-10-14 | Discharge: 2020-10-15 | Disposition: A | Discharge status: Home / Self Care | Payer: Medicaid Other | Attending: Emergency Medicine | Admitting: Emergency Medicine

## 2020-10-14 ENCOUNTER — Other Ambulatory Visit: Payer: Self-pay

## 2020-10-14 ENCOUNTER — Encounter: Payer: Self-pay | Admitting: Emergency Medicine

## 2020-10-14 DIAGNOSIS — Z79899 Other long term (current) drug therapy: Secondary | ICD-10-CM | POA: Insufficient documentation

## 2020-10-14 DIAGNOSIS — F17210 Nicotine dependence, cigarettes, uncomplicated: Secondary | ICD-10-CM | POA: Insufficient documentation

## 2020-10-14 DIAGNOSIS — I10 Essential (primary) hypertension: Secondary | ICD-10-CM | POA: Insufficient documentation

## 2020-10-14 DIAGNOSIS — L03011 Cellulitis of right finger: Secondary | ICD-10-CM | POA: Insufficient documentation

## 2020-10-14 MED ORDER — CLINDAMYCIN PHOSPHATE 600 MG/4ML IJ SOLN: 600.0000 mg | Freq: Once | INTRAMUSCULAR | Status: DC

## 2020-10-14 MED ORDER — SULFAMETHOXAZOLE-TRIMETHOPRIM 800-160 MG PO TABS: 1.0000 | ORAL_TABLET | Freq: Two times a day (BID) | ORAL | 0 refills | Status: AC

## 2020-10-14 MED ORDER — CLINDAMYCIN PHOSPHATE 600 MG/4ML IJ SOLN
600.0000 mg | Freq: Once | INTRAMUSCULAR | Status: AC
  Administered 2020-10-15: 600 mg via INTRAMUSCULAR
  Filled 2020-10-14 (×2): qty 4

## 2020-10-14 NOTE — ED Provider Notes (Signed)
[facility] Emergency Department Provider Note  ____________________________________________  Time seen: Approximately 9:54 PM  I have reviewed the triage vital signs and the nursing notes.   HISTORY  Chief Complaint finger swelling    HPI Tammy Mcdonald is a 40 y.o. female who presents the emergency department complaining of erythema, edema along the proximal phalanx of the  ring finger of the right hand.  Patient/good range of motion.  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  Patient has no fevers or chills.  No systemic complaints.  Patient does have a history of HIV, anxiety and hypertension.  Patient also has history of IV drug abuse.        Past Medical History:  Diagnosis Date  . Anxiety   . Hypertension     Patient Active Problem List   Diagnosis Date Noted  . Anxiety 06/28/2017  . Abdominal pain 06/18/2017  . Abnormal LFTs 06/18/2017  . Intractable nausea and vomiting 06/18/2017  . IV drug abuse (HCC) 06/18/2017    Past Surgical History:  Procedure Laterality Date  . TUBAL LIGATION  2003    Prior to Admission medications   Medication Sig Start Date End Date Taking? Authorizing Provider  sulfamethoxazole-trimethoprim (BACTRIM DS) 800-160 MG tablet Take 1 tablet by mouth 2 (two) times daily. 10/14/20  Yes Havish Petties, Delorise Royals, PA-C  buprenorphine-naloxone (SUBOXONE) 8-2 mg SUBL SL tablet Place 1 tablet under the tongue daily.    [provider]  propranolol (INDERAL) 40 MG tablet Take 1 tablet (40 mg total) by mouth 2 (two) times daily. Patient not taking: Reported on 06/18/2017 08/03/16   Rockne Menghini, MD    Allergies Other  Family History  Problem Relation Age of Onset  . Hypertension Mother   . Anxiety disorder Mother   . Rheumatologic disease Mother   . Hypertension Father   . Anxiety disorder Father   . Heart  attack Father   . Diabetes Sister   . Hypertension Sister   . Anxiety disorder Sister     Social History Social History   Tobacco Use  . Smoking status: Current Every Day Smoker    Packs/day: 1.00    Types: Cigarettes  . Smokeless tobacco: Never Used  Substance Use Topics  . Alcohol use: No  . Drug use: No     Review of Systems  Constitutional: No fever/chills Eyes: No visual changes. No discharge ENT: No upper respiratory complaints. Cardiovascular: no chest pain. Respiratory: no cough. No SOB. Gastrointestinal: No abdominal pain.  No nausea, no vomiting.  No diarrhea.  No constipation. Musculoskeletal: Pain, erythema, edema the proximal phalanx of the ring finger right hand Skin: Negative for rash, abrasions, lacerations, ecchymosis. Neurological: Negative for headaches, focal weakness or numbness.  10 System ROS otherwise negative.  ____________________________________________   PHYSICAL EXAM:  VITAL SIGNS: ED Triage Vitals  Enc Vitals Group     BP 10/14/20 1832 (!) 148/83     Pulse Rate 10/14/20 1832 94     Resp 10/14/20 1832 16     Temp 10/14/20 1832 97.6 F (36.4 C)     Temp Source 10/14/20 1832 Oral     SpO2 10/14/20 1832 99 %     Weight 10/14/20 1827 110 lb 0.2 oz (49.9 kg)     Height 10/14/20 1827 5\' 4"  (1.626 m)     Head Circumference --  Peak Flow --      Pain Score 10/14/20 1826 7     Pain Loc --      Pain Edu? --      Excl. in GC? --      Constitutional: Alert and oriented. Well appearing and in no acute distress. Eyes: Conjunctivae are normal. PERRL. EOMI. Head: Atraumatic. ENT:      Ears:       Nose: No congestion/rhinnorhea.      Mouth/Throat: Mucous membranes are moist.  Neck: No stridor.    Cardiovascular: Normal rate, regular rhythm. Normal S1 and S2.  Good peripheral circulation. Respiratory: Normal respiratory effort without tachypnea or retractions. Lungs CTAB. Good air entry to the bases with no decreased or absent breath  sounds. Musculoskeletal: Full range of motion to all extremities. No gross deformities appreciated.  Visualization of the right hand revealed mild erythema and edema about the proximal phalanx of the ring finger.  There is a centralized scab over this area as well.  Palpation reveals tenderness but no fluctuance or induration.  Patient has good range of motion to the patient has good range of motion to the MCP and PIP joints.  Sensation and capillary refill intact distally.  No extension of erythema, edema into the hand itself. Neurologic:  Normal speech and language. No gross focal neurologic deficits are appreciated.  Skin:  Skin is warm, dry and intact. No rash noted. Psychiatric: Mood and affect are normal. Speech and behavior are normal. Patient exhibits appropriate insight and judgement.   ____________________________________________   LABS (all labs ordered are listed, but only abnormal results are displayed)  Labs Reviewed - No data to display ____________________________________________  EKG   ____________________________________________  RADIOLOGY   No results found.  ____________________________________________    PROCEDURES  Procedure(s) performed:    Procedures    Medications  clindamycin (CLEOCIN) injection 600 mg (has no administration in time range)     ____________________________________________   INITIAL IMPRESSION / ASSESSMENT AND PLAN / ED COURSE  Pertinent labs & imaging results that were available during my care of the patient were reviewed by me and considered in my medical decision making (see chart for details).  Review of the [place] CSRS was performed in accordance of the [facility] prior to dispensing any controlled drugs.           Patient's diagnosis is consistent with cellulitis to the ring finger of the right hand.  Patient presented to the emergency department with pain, erythema and edema to the proximal phalanx of the ring finger.  She  does have a history of MRSA.  There is no concern at this time for infectious tenosynovitis that was on the differential.  Patient has what appears to be cellulitis and has been treated successfully in the past with Bactrim.  No evidence of abscess requiring incision and drainage.  No indication for labs or imaging.  Patient we will given shot of clindamycin here in the emergency department and transition to oral Bactrim at home.  Strict return precautions discussed with the patient.  Follow-up with primary care as needed..  Patient is given ED precautions to return to the ED for any worsening or new symptoms.     ____________________________________________  FINAL CLINICAL IMPRESSION(S) / ED DIAGNOSES  Final diagnoses:  Cellulitis of right ring finger      NEW MEDICATIONS STARTED DURING THIS VISIT:  ED Discharge Orders         Ordered    sulfamethoxazole-trimethoprim (BACTRIM DS)  800-160 MG tablet  2 times daily        10/14/20 2208              This chart was dictated using voice recognition software/Dragon. Despite best efforts to proofread, errors can occur which can change the meaning. Any change was purely unintentional.    Racheal Patches, PA-C 10/14/20 2209    Sharyn Creamer, MD 10/14/20 [phone]

## 2020-10-14 NOTE — ED Triage Notes (Signed)
C/O right ring finger swelling x 2 days.  States area started as a small bump and has worsened.

## 2021-06-15 ENCOUNTER — Other Ambulatory Visit: Payer: Self-pay

## 2021-06-15 ENCOUNTER — Encounter: Payer: Self-pay | Admitting: *Deleted

## 2021-06-15 DIAGNOSIS — Z5321 Procedure and treatment not carried out due to patient leaving prior to being seen by health care provider: Secondary | ICD-10-CM | POA: Insufficient documentation

## 2021-06-15 DIAGNOSIS — T7421XA Adult sexual abuse, confirmed, initial encounter: Secondary | ICD-10-CM | POA: Insufficient documentation

## 2021-06-15 LAB — URINE DRUG SCREEN, QUALITATIVE (ARMC ONLY)
Amphetamines, Ur Screen: POSITIVE — AB
Barbiturates, Ur Screen: NOT DETECTED
Benzodiazepine, Ur Scrn: NOT DETECTED
Cannabinoid 50 Ng, Ur ~~LOC~~: POSITIVE — AB
Cocaine Metabolite,Ur ~~LOC~~: NOT DETECTED
MDMA (Ecstasy)Ur Screen: NOT DETECTED
Methadone Scn, Ur: NOT DETECTED
Opiate, Ur Screen: NOT DETECTED
Phencyclidine (PCP) Ur S: NOT DETECTED
Tricyclic, Ur Screen: NOT DETECTED
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
## 2021-06-16 ENCOUNTER — Emergency Department
Admission: EM | Admit: 2021-06-16 | Discharge: 2021-06-16 | Disposition: A | Discharge status: Home / Self Care | Payer: Medicaid Other | Attending: Emergency Medicine | Admitting: Emergency Medicine

## 2021-06-16 LAB — PREGNANCY, URINE: Preg Test, Ur: NEGATIVE

## 2021-06-16 NOTE — ED Notes (Signed)
No answer when called several times from lobby 

## 2021-08-28 ENCOUNTER — Emergency Department
Admission: EM | Admit: 2021-08-28 | Discharge: 2021-08-28 | Discharge status: Against Medical Advice | Payer: Medicaid Other | Source: Home / Self Care

## 2023-03-30 ENCOUNTER — Other Ambulatory Visit: Payer: Self-pay | Admitting: Nurse Practitioner

## 2023-03-30 DIAGNOSIS — N83202 Unspecified ovarian cyst, left side: Secondary | ICD-10-CM

## 2023-03-30 DIAGNOSIS — Z1231 Encounter for screening mammogram for malignant neoplasm of breast: Secondary | ICD-10-CM

## 2023-04-14 ENCOUNTER — Ambulatory Visit: Admission: RE | Admit: 2023-04-14 | Payer: Medicaid Other | Source: Ambulatory Visit

## 2024-01-09 ENCOUNTER — Other Ambulatory Visit: Payer: Self-pay | Admitting: Nurse Practitioner

## 2024-01-09 DIAGNOSIS — Z1231 Encounter for screening mammogram for malignant neoplasm of breast: Secondary | ICD-10-CM

## 2024-05-12 ENCOUNTER — Emergency Department
Admission: EM | Admit: 2024-05-12 | Discharge: 2024-05-12 | Disposition: A | Discharge status: Home / Self Care | Attending: Emergency Medicine | Admitting: Emergency Medicine

## 2024-05-12 ENCOUNTER — Other Ambulatory Visit: Payer: Self-pay

## 2024-05-12 ENCOUNTER — Emergency Department

## 2024-05-12 DIAGNOSIS — I10 Essential (primary) hypertension: Secondary | ICD-10-CM | POA: Insufficient documentation

## 2024-05-12 DIAGNOSIS — R0789 Other chest pain: Secondary | ICD-10-CM | POA: Insufficient documentation

## 2024-05-12 LAB — CBC
HCT: 44.3 % (ref 36.0–46.0)
Hemoglobin: 14.5 g/dL (ref 12.0–15.0)
MCH: 29.6 pg (ref 26.0–34.0)
MCHC: 32.7 g/dL (ref 30.0–36.0)
MCV: 90.4 fL (ref 80.0–100.0)
Platelets: 431 K/uL — ABNORMAL HIGH (ref 150–400)
RBC: 4.9 MIL/uL (ref 3.87–5.11)
RDW: 13.3 % (ref 11.5–15.5)
WBC: 8.1 K/uL (ref 4.0–10.5)
nRBC: 0 % (ref 0.0–0.2)

## 2024-05-12 LAB — BASIC METABOLIC PANEL WITH GFR
Anion gap: 8 (ref 5–15)
BUN: 16 mg/dL (ref 6–20)
CO2: 26 mmol/L (ref 22–32)
Calcium: 9.1 mg/dL (ref 8.9–10.3)
Chloride: 106 mmol/L (ref 98–111)
Creatinine, Ser: 0.79 mg/dL (ref 0.44–1.00)
GFR, Estimated: >60 mL/min (ref >60)
Glucose, Bld: 125 mg/dL — ABNORMAL HIGH (ref 70–99)
Potassium: 4.6 mmol/L (ref 3.5–5.1)
Sodium: 140 mmol/L (ref 135–145)

## 2024-05-12 LAB — TROPONIN I (HIGH SENSITIVITY): Troponin I (High Sensitivity): 3 ng/L (ref <18)

## 2024-05-12 MED ORDER — OMEPRAZOLE 40 MG PO CPDR: 40.0000 mg | DELAYED_RELEASE_CAPSULE | Freq: Every day | ORAL | 1 refills | Status: AC

## 2024-05-12 NOTE — Discharge Instructions (Signed)
 Your chest pain is likely caused by stomach acid.  Start taking the Prilosec at least for the next few weeks.  Return to the ER immediately for new, worsening, or persistent severe chest pain, difficulty breathing, cough, fever, weakness, or any other new or worsening symptoms that concern you.

## 2024-05-12 NOTE — ED Provider Notes (Signed)
 [facility] Provider Note    Event Date/Time   First MD Initiated Contact with Patient 05/12/24 1354     (approximate)   History   Chest Pain   HPI  Tammy Mcdonald is a 43 y.o. female with a history of anxiety and hypertension who presents with chest pain over the last week, intermittent, described as a burning sensation, mainly occurring in upper part of her chest below her throat but sometimes going down to the lower part of the chest.  It is not associated with cough or shortness of breath.  It is worse in the morning.  The patient is living in a mobile home and states that she is concerned about possible mold inhalation.  I reviewed the past medical records.  The patient's most recent visit in our system was in the ED in 2022 for finger swelling.   Physical Exam   Triage Vital Signs: ED Triage Vitals  Encounter Vitals Group     BP 05/12/24 1338 (!) 163/89     Girls Systolic BP Percentile --      Girls Diastolic BP Percentile --      Boys Systolic BP Percentile --      Boys Diastolic BP Percentile --      Pulse Rate 05/12/24 1338 78     Resp 05/12/24 1338 20     Temp 05/12/24 1338 98 F (36.7 C)     Temp Source 05/12/24 1338 Oral     SpO2 05/12/24 1338 100 %     Weight 05/12/24 1337 118 lb (53.5 kg)     Height 05/12/24 1337 5' 4 (1.626 m)     Head Circumference --      Peak Flow --      Pain Score 05/12/24 1334 9     Pain Loc --      Pain Education --      Exclude from Growth Chart --     Most recent vital signs: Vitals:   05/12/24 1338 05/12/24 1439  BP: (!) 163/89 (!) 142/78  Pulse: 78 71  Resp: 20 17  Temp: 98 F (36.7 C) 98.1 F (36.7 C)  SpO2: 100% 97%     General: Awake, no distress.  CV:  Good peripheral perfusion.  Heart sounds. Resp:  Normal effort.  Lungs CTAB. Abd:  No distention.  Other:  No peripheral edema.   ED Results / Procedures / Treatments   Labs (all labs ordered are listed, but only abnormal  results are displayed) Labs Reviewed  BASIC METABOLIC PANEL WITH GFR - Abnormal; Notable for the following components:      Result Value   Glucose, Bld 125 (*)    All other components within normal limits  CBC - Abnormal; Notable for the following components:   Platelets 431 (*)    All other components within normal limits  POC URINE PREG, ED  TROPONIN I (HIGH SENSITIVITY)     EKG  ED ECG REPORT I, Waylon Cassis, the attending physician, personally viewed and interpreted this ECG.  Date: 05/12/2024 EKG Time: 1331  Rate: 9 Rhythm: normal sinus rhythm QRS Axis: normal Intervals: normal ST/T Wave abnormalities: normal Narrative Interpretation: no evidence of acute ischemia    RADIOLOGY  Chest x-ray: I reviewed and interpreted the images; there is no focal consolidation or edema  PROCEDURES:  Critical Care performed: No  Procedures   MEDICATIONS ORDERED IN ED: Medications - No data to display   IMPRESSION /  MDM / ASSESSMENT AND PLAN / ED COURSE  I reviewed the triage vital signs and the nursing notes.  43 year old female with PMH as noted above presents with intermittent atypical chest pain over the last week.  Physical exam is unremarkable for acute findings.  EKG is nonischemic.  Differential diagnosis includes, but is not limited to, GERD, musculoskeletal pain.  There is no clinical evidence for ACS.  There is no evidence for PE, or for aortic dissection or other vascular etiology.  BMP and CBC show no acute findings.  Troponin is negative.  Given the duration of the symptoms there is no indication for serial troponins.  Chest x-ray is clear.  Patient's presentation is most consistent with acute complicated illness / injury requiring diagnostic workup.  At this time, the patient is stable for discharge home.  Overall presentation is most consistent with reflux.  I counseled her on the results of the workup.  I have prescribed her Prilosec.  I gave strict  return precautions, and she expressed understanding.  FINAL CLINICAL IMPRESSION(S) / ED DIAGNOSES   Final diagnoses:  Atypical chest pain     Rx / DC Orders   ED Discharge Orders          Ordered    omeprazole  (PRILOSEC) 40 MG capsule  Daily        05/12/24 1419             Note:  This document was prepared using Dragon voice recognition software and may include unintentional dictation errors.    Jacolyn Pae, MD 05/12/24 1451

## 2024-05-12 NOTE — ED Triage Notes (Signed)
 Pt to ED for chest pain since 1 week. Burning and tight feeling to upper chest. Pt takes Suboxone  daily and just started IM suboxone  last week.  Pt in NAD, skin dry, respirations unlabored.
